# Patient Record
Sex: Male | Born: 1980
Health system: Southern US, Community
[De-identification: ages and names within clinical notes are randomized; demographics above are authoritative.]

## PROBLEM LIST (undated history)

## (undated) DIAGNOSIS — Z86010 Personal history of colonic polyps: Secondary | ICD-10-CM

## (undated) DIAGNOSIS — R040 Epistaxis: Secondary | ICD-10-CM

## (undated) DIAGNOSIS — M1990 Unspecified osteoarthritis, unspecified site: Secondary | ICD-10-CM

## (undated) DIAGNOSIS — K5731 Diverticulosis of large intestine without perforation or abscess with bleeding: Secondary | ICD-10-CM

## (undated) DIAGNOSIS — J45909 Unspecified asthma, uncomplicated: Secondary | ICD-10-CM

## (undated) DIAGNOSIS — K589 Irritable bowel syndrome without diarrhea: Secondary | ICD-10-CM

## (undated) DIAGNOSIS — I480 Paroxysmal atrial fibrillation: Secondary | ICD-10-CM

## (undated) DIAGNOSIS — K5100 Ulcerative (chronic) pancolitis without complications: Secondary | ICD-10-CM

## (undated) DIAGNOSIS — I872 Venous insufficiency (chronic) (peripheral): Secondary | ICD-10-CM

## (undated) DIAGNOSIS — I10 Essential (primary) hypertension: Secondary | ICD-10-CM

## (undated) DIAGNOSIS — D649 Anemia, unspecified: Secondary | ICD-10-CM

## (undated) DIAGNOSIS — N400 Enlarged prostate without lower urinary tract symptoms: Secondary | ICD-10-CM

## (undated) HISTORY — DX: Ulcerative (chronic) pancolitis without complications: K51.00

## (undated) HISTORY — DX: Paroxysmal atrial fibrillation: I48.0

## (undated) HISTORY — DX: Venous insufficiency (chronic) (peripheral): I87.2

## (undated) HISTORY — DX: Unspecified asthma, uncomplicated: J45.909

## (undated) HISTORY — DX: Diverticulosis of large intestine without perforation or abscess with bleeding: K57.31

## (undated) HISTORY — DX: Irritable bowel syndrome, unspecified: K58.9

## (undated) HISTORY — DX: Anemia, unspecified: D64.9

## (undated) HISTORY — DX: Unspecified osteoarthritis, unspecified site: M19.90

## (undated) HISTORY — DX: Epistaxis: R04.0

## (undated) HISTORY — DX: Essential (primary) hypertension: I10

## (undated) HISTORY — PX: OTHER SURGICAL HISTORY: SHX169

## (undated) HISTORY — DX: Benign prostatic hyperplasia without lower urinary tract symptoms: N40.0

---
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
## 2003-12-27 DIAGNOSIS — Z860101 Personal history of adenomatous and serrated colon polyps: Secondary | ICD-10-CM

## 2003-12-27 DIAGNOSIS — Z86010 Personal history of colonic polyps: Secondary | ICD-10-CM

## 2003-12-27 HISTORY — DX: Personal history of adenomatous and serrated colon polyps: Z86.0101

## 2003-12-27 HISTORY — DX: Personal history of colonic polyps: Z86.010

## 2008-12-23 ENCOUNTER — Emergency Department (HOSPITAL_COMMUNITY): Admission: EM | Admit: 2008-12-23 | Discharge: 2008-12-23 | Payer: Self-pay | Admitting: Emergency Medicine

## 2009-02-15 ENCOUNTER — Emergency Department (HOSPITAL_COMMUNITY): Admission: EM | Admit: 2009-02-15 | Discharge: 2009-02-15 | Payer: Self-pay | Admitting: Emergency Medicine

## 2011-03-07 LAB — URINALYSIS, ROUTINE W REFLEX MICROSCOPIC
Glucose, UA: NEGATIVE mg/dL
Ketones, ur: NEGATIVE mg/dL
pH: 6.5 (ref 5.0–8.0)

## 2011-03-11 LAB — RAPID STREP SCREEN (MED CTR MEBANE ONLY): Streptococcus, Group A Screen (Direct): NEGATIVE

## 2011-03-11 LAB — MONONUCLEOSIS SCREEN: Mono Screen: NEGATIVE

## 2014-10-24 ENCOUNTER — Ambulatory Visit (INDEPENDENT_AMBULATORY_CARE_PROVIDER_SITE_OTHER): Payer: Managed Care, Other (non HMO) | Admitting: Family Medicine

## 2014-10-24 ENCOUNTER — Encounter: Payer: Self-pay | Admitting: Family Medicine

## 2014-10-24 VITALS — BP 138/90 | Ht 69.0 in | Wt 180.0 lb

## 2014-10-24 DIAGNOSIS — Z0000 Encounter for general adult medical examination without abnormal findings: Secondary | ICD-10-CM

## 2014-10-24 NOTE — Progress Notes (Signed)
   Subjective:    Patient ID: James Berg, male    DOB: [birth year], 33 y.o.   MRN: [id number]  HPI The patient comes in today for a wellness visit. A review of their health history was completed.  A review of medications was also completed.  Any needed refills: No  Eating habits: Health conscious  Falls/  MVA accidents in past few months: No  Regular exercise: Yes, cardio/weight training 3-5 times per week.    Specialist pt sees on regular basis: No  Preventative health issues were discussed.   Additional concerns: No, refused flu vaccine.  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  HENT: Negative for congestion and rhinorrhea.   Eyes: Negative for discharge.  Respiratory: Negative for cough and wheezing.   Cardiovascular: Negative for chest pain.  Gastrointestinal: Negative for vomiting, abdominal pain and blood in stool.  Genitourinary: Negative for frequency and difficulty urinating.  Musculoskeletal: Negative for neck pain.  Skin: Negative for rash.  Allergic/Immunologic: Negative for environmental allergies and food allergies.  Neurological: Negative for weakness and headaches.  Psychiatric/Behavioral: Negative for agitation.  All other systems reviewed and are negative.      Objective:   Physical Exam  Constitutional: He appears well-developed and well-nourished.  HENT:  Head: Normocephalic and atraumatic.  Right Ear: External ear normal.  Left Ear: External ear normal.  Nose: Nose normal.  Mouth/Throat: Oropharynx is clear and moist.  Eyes: EOM are normal. Pupils are equal, round, and reactive to light.  Neck: Normal range of motion. Neck supple. No thyromegaly present.  Cardiovascular: Normal rate, regular rhythm and normal heart sounds.   No murmur  heard. Pulmonary/Chest: Effort normal and breath sounds normal. No respiratory distress. He has no wheezes.  Abdominal: Soft. Bowel sounds are normal. He exhibits no distension and no mass. There is no tenderness.  Genitourinary: Penis normal.  Musculoskeletal: Normal range of motion. He exhibits no edema.  Lymphadenopathy:    He has no cervical adenopathy.  Neurological: He is alert. He exhibits normal muscle tone.  Skin: Skin is warm and dry. No erythema.  Psychiatric: He has a normal mood and affect. His behavior is normal. Judgment normal.  Vitals reviewed.         Assessment & Plan:  Impression wellness exam plan diet exercise discussed. Appropriate blood work. WSL

## 2014-10-29 ENCOUNTER — Encounter: Payer: Self-pay | Admitting: *Deleted

## 2014-11-01 ENCOUNTER — Encounter: Payer: Self-pay | Admitting: Family Medicine

## 2014-11-01 LAB — HEPATIC FUNCTION PANEL
ALBUMIN: 4.2 g/dL (ref 3.5–5.2)
ALT: 30 U/L (ref 0–53)
AST: 23 U/L (ref 0–37)
Alkaline Phosphatase: 36 U/L — ABNORMAL LOW (ref 39–117)
BILIRUBIN DIRECT: 0.2 mg/dL (ref 0.0–0.3)
BILIRUBIN TOTAL: 0.6 mg/dL (ref 0.2–1.2)
Indirect Bilirubin: 0.4 mg/dL (ref 0.2–1.2)
Total Protein: 6.5 g/dL (ref 6.0–8.3)

## 2014-11-01 LAB — BASIC METABOLIC PANEL
BUN: 28 mg/dL — ABNORMAL HIGH (ref 6–23)
CALCIUM: 9.1 mg/dL (ref 8.4–10.5)
CHLORIDE: 104 meq/L (ref 96–112)
CO2: 30 meq/L (ref 19–32)
Creat: 1.09 mg/dL (ref 0.50–1.35)
GLUCOSE: 91 mg/dL (ref 70–99)
POTASSIUM: 4.5 meq/L (ref 3.5–5.3)
SODIUM: 140 meq/L (ref 135–145)

## 2014-11-01 LAB — LIPID PANEL
CHOL/HDL RATIO: 2.2 ratio
Cholesterol: 138 mg/dL (ref 0–200)
HDL: 63 mg/dL (ref >39)
LDL CALC: 65 mg/dL (ref 0–99)
Triglycerides: 52 mg/dL (ref <150)
VLDL: 10 mg/dL (ref 0–40)

## 2014-12-30 ENCOUNTER — Ambulatory Visit: Payer: Managed Care, Other (non HMO) | Admitting: Family Medicine

## 2014-12-30 ENCOUNTER — Encounter: Payer: Self-pay | Admitting: Family Medicine

## 2014-12-30 ENCOUNTER — Ambulatory Visit (INDEPENDENT_AMBULATORY_CARE_PROVIDER_SITE_OTHER): Payer: Managed Care, Other (non HMO) | Admitting: Family Medicine

## 2014-12-30 VITALS — BP 122/78 | Temp 98.4°F | Ht 69.0 in | Wt 181.4 lb

## 2014-12-30 DIAGNOSIS — A084 Viral intestinal infection, unspecified: Secondary | ICD-10-CM

## 2014-12-30 MED ORDER — ONDANSETRON HCL 8 MG PO TABS: 8.0000 mg | ORAL_TABLET | Freq: Three times a day (TID) | ORAL | Status: DC | PRN

## 2014-12-30 MED ORDER — PROMETHAZINE HCL 25 MG PO TABS: ORAL_TABLET | ORAL | Status: DC

## 2014-12-30 NOTE — Progress Notes (Signed)
   Subjective:    Patient ID: James Berg, male    DOB: [birth year], 34 y.o.   MRN: [id number]  Emesis  This is a new problem. The current episode started yesterday. The problem occurs intermittently. The problem has been unchanged. The emesis has an appearance of stomach contents. The maximum temperature recorded prior to his arrival was 100.4 - 100.9 F. Associated symptoms include abdominal pain, diarrhea and myalgias. Pertinent negatives include no chest pain or coughing. He has tried nothing for the symptoms. The treatment provided no relief.   all of his illness kicked in late afternoon started with nausea abdominal cramping then vomiting and diarrhea vomiting is eased up some today stopped at 3 AM. No bloody stools no bloody vomitus pmh benign   Review of Systems  Constitutional: Positive for appetite change. Negative for activity change.  HENT: Negative for congestion.   Respiratory: Negative for cough and chest tightness.   Cardiovascular: Negative for chest pain.  Gastrointestinal: Positive for vomiting, abdominal pain and diarrhea.  Genitourinary: Negative for frequency.  Musculoskeletal: Positive for myalgias.       Objective:   Physical Exam  Constitutional: He appears well-developed.  HENT:  Head: Normocephalic.  Neck: Normal range of motion. Neck supple.  Cardiovascular: Normal rate, regular rhythm and normal heart sounds.   Pulmonary/Chest: Breath sounds normal. No respiratory distress.  Abdominal: Soft. He exhibits no distension. There is no tenderness. There is no guarding.  Lymphadenopathy:    He has no cervical adenopathy.          Assessment & Plan:  Viral gastroenteritis no need for any antibiotics I don't recommend lab work or x-rays. Oral rehydration recommended clear sodas or diluted Gatorade. Should gradually improve over the next 48 hours Zofran for nausea Phenergan if necessary warning signs discussed

## 2015-06-28 DIAGNOSIS — Z029 Encounter for administrative examinations, unspecified: Secondary | ICD-10-CM

## 2015-07-26 ENCOUNTER — Encounter: Payer: Self-pay | Admitting: Family Medicine

## 2015-09-05 DIAGNOSIS — Z029 Encounter for administrative examinations, unspecified: Secondary | ICD-10-CM

## 2017-04-02 ENCOUNTER — Encounter: Payer: Self-pay | Admitting: Nurse Practitioner

## 2017-04-02 ENCOUNTER — Ambulatory Visit (INDEPENDENT_AMBULATORY_CARE_PROVIDER_SITE_OTHER): Payer: BLUE CROSS/BLUE SHIELD | Admitting: Nurse Practitioner

## 2017-04-02 VITALS — BP 124/80 | Temp 98.2°F | Ht 69.0 in | Wt 181.0 lb

## 2017-04-02 DIAGNOSIS — L237 Allergic contact dermatitis due to plants, except food: Secondary | ICD-10-CM | POA: Diagnosis not present

## 2017-04-02 MED ORDER — PREDNISONE 20 MG PO TABS: ORAL_TABLET | ORAL | 0 refills | Status: DC

## 2017-04-02 MED ORDER — METHYLPREDNISOLONE ACETATE 40 MG/ML IJ SUSP
40.0000 mg | Freq: Once | INTRAMUSCULAR | Status: AC
  Administered 2017-04-02: 40 mg via INTRAMUSCULAR

## 2017-04-02 NOTE — Progress Notes (Signed)
Subjective:  Presents for complaints of a poison oak rash spreading on his arms for the past 10 days. Was not able to contain rash with topical steroid cream. Has had a similar reaction to poison oak in the past. Began after working outside. No difficulty breathing or swallowing. No fever. Rash very pruritic. Has been spreading fairly quickly over the past few days.  Objective:   BP 124/80   Temp 98.2 F (36.8 C) (Oral)   Ht 5\' 9"  (1.753 m)   Wt 181 lb (82.1 kg)   BMI 26.73 kg/m  NAD. Alert, oriented. Lungs clear. Heart regular rate rhythm. Raised confluent linear vesicular rash noted on both lower arms with scattered smaller lesions on both arms, no other rash noted.  Assessment:  Allergic contact dermatitis due to plants, except food - Plan: methylPREDNISolone acetate (DEPO-MEDROL) injection 40 mg    Plan:   Meds ordered this encounter  Medications  . predniSONE (DELTASONE) 20 MG tablet    Sig: 3 po qd x 3 d then 2 po qd x 3 d then 1 po qd x 2 d; start 5/10    Dispense:  17 tablet    Refill:  0    Order Specific Question:   Supervising Provider    Answer:   Merlyn AlbertLUKING, Bryen S [[id number]]  . methylPREDNISolone acetate (DEPO-MEDROL) injection 40 mg   OTC antihistamines as directed. May continue OTC topical steroid. Warning signs reviewed. Call back in 4-5 days if no significant improvement, sooner if worse.

## 2017-04-02 NOTE — Patient Instructions (Signed)
claritin 10 mg in the morning Benadryl 25 mg in the evening

## 2017-10-21 ENCOUNTER — Telehealth: Payer: Self-pay | Admitting: Family Medicine

## 2017-10-21 DIAGNOSIS — Z0000 Encounter for general adult medical examination without abnormal findings: Secondary | ICD-10-CM

## 2017-10-21 NOTE — Telephone Encounter (Signed)
Rep same 

## 2017-10-21 NOTE — Telephone Encounter (Signed)
Pt.notified

## 2017-10-21 NOTE — Telephone Encounter (Signed)
Pt is requesting lab orders to be sent over for an upcoming physical. Last labs per epic were: bmp,hepatic,and lipid on 10/31/2014.

## 2017-10-21 NOTE — Telephone Encounter (Signed)
Orders put in. Left message for pt to return call  

## 2017-11-10 DIAGNOSIS — Z0000 Encounter for general adult medical examination without abnormal findings: Secondary | ICD-10-CM | POA: Diagnosis not present

## 2017-11-11 LAB — LIPID PANEL
CHOL/HDL RATIO: 2.5 ratio (ref 0.0–5.0)
Cholesterol, Total: 152 mg/dL (ref 100–199)
HDL: 60 mg/dL (ref >39)
LDL CALC: 81 mg/dL (ref 0–99)
TRIGLYCERIDES: 54 mg/dL (ref 0–149)
VLDL CHOLESTEROL CAL: 11 mg/dL (ref 5–40)

## 2017-11-11 LAB — BASIC METABOLIC PANEL
BUN / CREAT RATIO: 22 — AB (ref 9–20)
BUN: 22 mg/dL — AB (ref 6–20)
CALCIUM: 9 mg/dL (ref 8.7–10.2)
CHLORIDE: 104 mmol/L (ref 96–106)
CO2: 26 mmol/L (ref 20–29)
Creatinine, Ser: 0.99 mg/dL (ref 0.76–1.27)
GFR calc Af Amer: 114 mL/min/{1.73_m2} (ref >59)
GFR calc non Af Amer: 98 mL/min/{1.73_m2} (ref >59)
GLUCOSE: 101 mg/dL — AB (ref 65–99)
Potassium: 4.8 mmol/L (ref 3.5–5.2)
Sodium: 142 mmol/L (ref 134–144)

## 2017-11-11 LAB — HEPATIC FUNCTION PANEL
ALT: 34 IU/L (ref 0–44)
AST: 20 IU/L (ref 0–40)
Albumin: 4.2 g/dL (ref 3.5–5.5)
Alkaline Phosphatase: 42 IU/L (ref 39–117)
BILIRUBIN, DIRECT: 0.23 mg/dL (ref 0.00–0.40)
Bilirubin Total: 0.6 mg/dL (ref 0.0–1.2)
Total Protein: 6.4 g/dL (ref 6.0–8.5)

## 2017-11-20 ENCOUNTER — Encounter: Payer: Self-pay | Admitting: Family Medicine

## 2017-11-20 ENCOUNTER — Ambulatory Visit (INDEPENDENT_AMBULATORY_CARE_PROVIDER_SITE_OTHER): Payer: BLUE CROSS/BLUE SHIELD | Admitting: Family Medicine

## 2017-11-20 VITALS — BP 132/88 | Ht 69.0 in | Wt 187.1 lb

## 2017-11-20 DIAGNOSIS — Z0000 Encounter for general adult medical examination without abnormal findings: Secondary | ICD-10-CM

## 2017-11-20 NOTE — Progress Notes (Signed)
Subjective:    Patient ID: James Berg, male    DOB: 1981-06-06, 36 y.o.   MRN: [id number]  HPI The patient comes in today for a wellness visit.  Results for orders placed or performed in visit on 10/21/17  Lipid panel  Result Value Ref Range   Cholesterol, Total 152 100 - 199 mg/dL   Triglycerides 54 0 - 149 mg/dL   HDL 60 >40>39 mg/dL   VLDL Cholesterol Cal 11 5 - 40 mg/dL   LDL Calculated 81 0 - 99 mg/dL   Chol/HDL Ratio 2.5 0.0 - 5.0 ratio  Hepatic function panel  Result Value Ref Range   Total Protein 6.4 6.0 - 8.5 g/dL   Albumin 4.2 3.5 - 5.5 g/dL   Bilirubin Total 0.6 0.0 - 1.2 mg/dL   Bilirubin, Direct 9.810.23 0.00 - 0.40 mg/dL   Alkaline Phosphatase 42 39 - 117 IU/L   AST 20 0 - 40 IU/L   ALT 34 0 - 44 IU/L  Basic metabolic panel  Result Value Ref Range   Glucose 101 (H) 65 - 99 mg/dL   BUN 22 (H) 6 - 20 mg/dL   Creatinine, Ser 1.910.99 0.76 - 1.27 mg/dL   GFR calc non Af Amer 98 >59 mL/min/1.73   GFR calc Af Amer 114 >59 mL/min/1.73   BUN/Creatinine Ratio 22 (H) 9 - 20   Sodium 142 134 - 144 mmol/L   Potassium 4.8 3.5 - 5.2 mmol/L   Chloride 104 96 - 106 mmol/L   CO2 26 20 - 29 mmol/L   Calcium 9.0 8.7 - 10.2 mg/dL     A review of their health history was completed.  A review of medications was also completed.  Any needed refills; No  Eating habits: [place]/  MVA accidents in past few months: No  Regular exercise: No  Specialist pt sees on regular basis: No  Preventative health issues were discussed.   Additional concerns: None  BP decent when self cks   No tobacco no acohol  Exercise not good, not since 11 n ago,,  Some weight gain and loss of acrdio capability with no exercise  Migraines occur rarely, comes from remote accident along with hx of hip pin, chiro are helps      daypk and night pk with energy capsule vitamin an fish oils  And vyrotech ?? [facility] exact omponents, alsge suppp coronella ,,melatonin ,  did hepatitis studies  all negative,   Review of Systems  Constitutional: Negative for activity change, appetite change and fever.  HENT: Negative for congestion and rhinorrhea.   Eyes: Negative for discharge.  Respiratory: Negative for cough and wheezing.   Cardiovascular: Negative for chest pain.  Gastrointestinal: Negative for abdominal pain, blood in stool and vomiting.  Genitourinary: Negative for difficulty urinating and frequency.  Musculoskeletal: Negative for neck pain.  Skin: Negative for rash.  Allergic/Immunologic: Negative for environmental allergies and food allergies.  Neurological: Negative for weakness and headaches.  Psychiatric/Behavioral: Negative for agitation.  All other systems reviewed and are negative.      Objective:   Physical Exam  Constitutional: He appears well-developed and well-nourished.  HENT:  Head: Normocephalic and atraumatic.  Right Ear: External ear normal.  Left Ear: External ear normal.  Nose: Nose normal.  Mouth/Throat: Oropharynx is clear and moist.  Eyes: EOM are normal. Pupils are equal, round, and reactive to light.  Neck: Normal range of motion. Neck supple. No thyromegaly present.  Cardiovascular: Normal rate, regular rhythm  and normal heart sounds.  No murmur heard. Pulmonary/Chest: Effort normal and breath sounds normal. No respiratory distress. He has no wheezes.  Abdominal: Soft. Bowel sounds are normal. He exhibits no distension and no mass. There is no tenderness.  Genitourinary: Penis normal.  Musculoskeletal: Normal range of motion. He exhibits no edema.  Lymphadenopathy:    He has no cervical adenopathy.  Neurological: He is alert. He exhibits normal muscle tone.  Skin: Skin is warm and dry. No erythema.  Psychiatric: He has a normal mood and affect. His behavior is normal. Judgment normal.  Vitals reviewed.         Assessment & Plan:  Impression well-patient has been off and exercise.  Discussed and encouraged to reinstitute.  Diet  discussed.  Blood work discussed.  Exam.  No need for further testing at this time.  Patient still goes to a physical therapist rather chiropractor for regular adjustments which appeared to diminish this

## 2017-11-20 NOTE — Patient Instructions (Signed)
Results for orders placed or performed in visit on 10/21/17  Lipid panel  Result Value Ref Range   Cholesterol, Total 152 100 - 199 mg/dL   Triglycerides 54 0 - 149 mg/dL   HDL 60 >16>39 mg/dL   VLDL Cholesterol Cal 11 5 - 40 mg/dL   LDL Calculated 81 0 - 99 mg/dL   Chol/HDL Ratio 2.5 0.0 - 5.0 ratio  Hepatic function panel  Result Value Ref Range   Total Protein 6.4 6.0 - 8.5 g/dL   Albumin 4.2 3.5 - 5.5 g/dL   Bilirubin Total 0.6 0.0 - 1.2 mg/dL   Bilirubin, Direct 1.090.23 0.00 - 0.40 mg/dL   Alkaline Phosphatase 42 39 - 117 IU/L   AST 20 0 - 40 IU/L   ALT 34 0 - 44 IU/L  Basic metabolic panel  Result Value Ref Range   Glucose 101 (H) 65 - 99 mg/dL   BUN 22 (H) 6 - 20 mg/dL   Creatinine, Ser 6.040.99 0.76 - 1.27 mg/dL   GFR calc non Af Amer 98 >59 mL/min/1.73   GFR calc Af Amer 114 >59 mL/min/1.73   BUN/Creatinine Ratio 22 (H) 9 - 20   Sodium 142 134 - 144 mmol/L   Potassium 4.8 3.5 - 5.2 mmol/L   Chloride 104 96 - 106 mmol/L   CO2 26 20 - 29 mmol/L   Calcium 9.0 8.7 - 10.2 mg/dL

## 2017-12-18 DIAGNOSIS — M545 Low back pain: Secondary | ICD-10-CM | POA: Diagnosis not present

## 2017-12-18 DIAGNOSIS — M9902 Segmental and somatic dysfunction of thoracic region: Secondary | ICD-10-CM | POA: Diagnosis not present

## 2017-12-18 DIAGNOSIS — M9903 Segmental and somatic dysfunction of lumbar region: Secondary | ICD-10-CM | POA: Diagnosis not present

## 2018-01-06 ENCOUNTER — Telehealth: Payer: Self-pay | Admitting: Family Medicine

## 2018-01-06 MED ORDER — OSELTAMIVIR PHOSPHATE 75 MG PO CAPS: 75.0000 mg | ORAL_CAPSULE | Freq: Two times a day (BID) | ORAL | 0 refills | Status: DC

## 2018-01-06 NOTE — Telephone Encounter (Signed)
Go ahead bu,t takendont use unoless symtoms start

## 2018-01-06 NOTE — Telephone Encounter (Signed)
Prescription sent electronically to pharmacy. Patient notified. 

## 2018-01-06 NOTE — Telephone Encounter (Signed)
Both children just tested postive for the flu, and wife just started showing symptoms and is getting Tamiflu called in.  He wants to know if he can get some called in also to have on standby.  He does not have any symptoms yet.  [facility]

## 2018-05-07 DIAGNOSIS — M542 Cervicalgia: Secondary | ICD-10-CM | POA: Diagnosis not present

## 2018-05-07 DIAGNOSIS — M9903 Segmental and somatic dysfunction of lumbar region: Secondary | ICD-10-CM | POA: Diagnosis not present

## 2018-05-07 DIAGNOSIS — M545 Low back pain: Secondary | ICD-10-CM | POA: Diagnosis not present

## 2018-05-07 DIAGNOSIS — M9901 Segmental and somatic dysfunction of cervical region: Secondary | ICD-10-CM | POA: Diagnosis not present

## 2018-10-14 DIAGNOSIS — M546 Pain in thoracic spine: Secondary | ICD-10-CM | POA: Diagnosis not present

## 2018-10-14 DIAGNOSIS — M9901 Segmental and somatic dysfunction of cervical region: Secondary | ICD-10-CM | POA: Diagnosis not present

## 2018-10-14 DIAGNOSIS — M542 Cervicalgia: Secondary | ICD-10-CM | POA: Diagnosis not present

## 2018-10-30 DIAGNOSIS — M9902 Segmental and somatic dysfunction of thoracic region: Secondary | ICD-10-CM | POA: Diagnosis not present

## 2018-10-30 DIAGNOSIS — M542 Cervicalgia: Secondary | ICD-10-CM | POA: Diagnosis not present

## 2018-10-30 DIAGNOSIS — M546 Pain in thoracic spine: Secondary | ICD-10-CM | POA: Diagnosis not present

## 2018-10-30 DIAGNOSIS — M9901 Segmental and somatic dysfunction of cervical region: Secondary | ICD-10-CM | POA: Diagnosis not present

## 2018-11-17 ENCOUNTER — Telehealth: Payer: Self-pay | Admitting: Family Medicine

## 2018-11-17 MED ORDER — PREDNISONE 20 MG PO TABS: ORAL_TABLET | ORAL | 0 refills | Status: DC

## 2018-11-17 NOTE — Telephone Encounter (Signed)
Prescription sent electronically to pharmacy. Left message to return call to notify patient. 

## 2018-11-17 NOTE — Telephone Encounter (Signed)
Patient has some poison oak on his chest and a little on his arm and wanted to know if we could call in something to [facility].

## 2018-11-17 NOTE — Telephone Encounter (Signed)
Adult pred taper 

## 2018-11-17 NOTE — Telephone Encounter (Signed)
Patient notified

## 2018-11-17 NOTE — Telephone Encounter (Signed)
Please advise 

## 2019-04-27 ENCOUNTER — Other Ambulatory Visit: Payer: Self-pay

## 2019-04-27 ENCOUNTER — Ambulatory Visit (INDEPENDENT_AMBULATORY_CARE_PROVIDER_SITE_OTHER): Payer: BC Managed Care – PPO | Admitting: Family Medicine

## 2019-04-27 DIAGNOSIS — R21 Rash and other nonspecific skin eruption: Secondary | ICD-10-CM

## 2019-04-27 MED ORDER — PREDNISONE 20 MG PO TABS: ORAL_TABLET | ORAL | 0 refills | Status: DC

## 2019-04-27 NOTE — Progress Notes (Signed)
   Subjective:    Patient ID: James Berg, male    DOB: 16-Feb-1981, 38 y.o.   MRN: [id number] Audio plus visual HPI Pt has poison oak on face. Pt states this started on Saturday. One spot first and then yesterday it started spreading. No swelling no shortness of breath. Pt has been putting the clear calamine lotion.   Virtual Visit via Video Note  I connected with James Berg on 04/27/19 at 11:30 AM EDT by a video enabled telemedicine application and verified that I am speaking with the correct person using two identifiers.  Location: Patient: home Provider: office   I discussed the limitations of evaluation and management by telemedicine and the availability of in person appointments. The patient expressed understanding and agreed to proceed.  History of Present Illness:    Observations/Objective:   Assessment and Plan:   Follow Up Instructions:    I discussed the assessment and treatment plan with the patient. The patient was provided an opportunity to ask questions and all were answered. The patient agreed with the plan and demonstrated an understanding of the instructions.   The patient was advised to call back or seek an in-person evaluation if the symptoms worsen or if the condition fails to improve as anticipated.  I provided 15 minutes of non-face-to-face time during this encounter.  Developed substantial rash.  First moved into the lip.  Then she.  Also on her neck.  Scalp also involved.  Very pruritic.  Minimally responsive to topical calamine.  Using quite frequently [place], LPN    Review of Systems No headache, no major weight loss or weight gain, no chest pain no back pain abdominal pain no change in bowel habits complete ROS otherwise negative     Objective:   Physical Exam  Virtual visit      Assessment & Plan:  Impression contact dermatitis.  Avoidance measures discussed.  Local measures discussed.  Steroid taper prescribed  symptom care discussed questions answered

## 2019-09-02 ENCOUNTER — Other Ambulatory Visit: Payer: Self-pay | Admitting: *Deleted

## 2019-09-02 ENCOUNTER — Ambulatory Visit (INDEPENDENT_AMBULATORY_CARE_PROVIDER_SITE_OTHER): Payer: BC Managed Care – PPO | Admitting: Family Medicine

## 2019-09-02 ENCOUNTER — Other Ambulatory Visit: Payer: Self-pay

## 2019-09-02 DIAGNOSIS — J310 Chronic rhinitis: Secondary | ICD-10-CM

## 2019-09-02 DIAGNOSIS — Z20828 Contact with and (suspected) exposure to other viral communicable diseases: Secondary | ICD-10-CM | POA: Diagnosis not present

## 2019-09-02 DIAGNOSIS — Z20822 Contact with and (suspected) exposure to covid-19: Secondary | ICD-10-CM

## 2019-09-02 DIAGNOSIS — J329 Chronic sinusitis, unspecified: Secondary | ICD-10-CM | POA: Diagnosis not present

## 2019-09-02 MED ORDER — CEFDINIR 300 MG PO CAPS: 300.0000 mg | ORAL_CAPSULE | Freq: Two times a day (BID) | ORAL | 0 refills | Status: DC

## 2019-09-02 NOTE — Progress Notes (Signed)
   Subjective:  Audio post video  Patient ID: James Berg, male    DOB: October 05, 1981, 38 y.o.   MRN: [id number]  Sore Throat  This is a new problem. Episode onset: Friday  Associated symptoms include congestion and coughing. Associated symptoms comments: Did not sleep well due to neck discomfort last night, green mucus when blowing nose or coughing, pt states he was hot last night but no fever. Treatments tried: Mucinex, Ibuprofen and Vit C. The treatment provided mild relief.   Virtual Visit via Video Note  I connected with [name] on 09/02/19 at  2:00 PM EDT by a video enabled telemedicine application and verified that I am speaking with the correct person using two identifiers.  Location: Patient: phone Provider: office   I discussed the limitations of evaluation and management by telemedicine and the availability of in person appointments. The patient expressed understanding and agreed to proceed.  History of Present Illness:    Observations/Objective:   Assessment and Plan:   Follow Up Instructions:    I discussed the assessment and treatment plan with the patient. The patient was provided an opportunity to ask questions and all were answered. The patient agreed with the plan and demonstrated an understanding of the instructions.   The patient was advised to call back or seek an in-person evaluation if the symptoms worsen or if the condition fails to improve as anticipated.  I provided 18 minutes of non-face-to-face time during this encounter.   Vicente Males, LPN    Review of Systems  HENT: Positive for congestion.   Respiratory: Positive for cough.        Objective:   Physical Exam  Virtual      Assessment & Plan:  Impression probable rhinosinusitis.  Discussed.  Antibiotics prescribed symptom care discussed.  However with potential contact and symptoms at least somewhat consistent with coronavirus recommend testing for this rationale  discussed we will proceed

## 2019-09-04 DIAGNOSIS — B349 Viral infection, unspecified: Secondary | ICD-10-CM | POA: Diagnosis not present

## 2019-09-04 LAB — NOVEL CORONAVIRUS, NAA: SARS-CoV-2, NAA: NOT DETECTED

## 2019-09-05 ENCOUNTER — Encounter: Payer: Self-pay | Admitting: Family Medicine

## 2019-09-06 ENCOUNTER — Telehealth: Payer: Self-pay | Admitting: Family Medicine

## 2019-09-06 MED ORDER — CLARITHROMYCIN 500 MG PO TABS: ORAL_TABLET | ORAL | 0 refills | Status: DC

## 2019-09-06 NOTE — Telephone Encounter (Signed)
Please advise. Thank you

## 2019-09-06 NOTE — Telephone Encounter (Signed)
Pt was seen 09/02/2019 virtual with Dr. Richardson Landry &  at Urgent Care Saturday, pt's had a negative flu test & negative Covid test  Pt's been taking Advil & antibiotics - fever finally broke  Pt is having chills, shaking, body aches, coughing up brown stuff, yellow nasal congestion, weak-hard to walk, burning sensatoin on the skin back & thighs  Wife wonders if he should continue the cefdinir (OMNICEF) 300 MG capsule or maybe we could change med?   Please advise & call pt

## 2019-09-06 NOTE — Telephone Encounter (Signed)
Might be a good idea to switch to a different antibiotic class to see if covers better, stop omnicef, start biaxin 500 one bid seven d, this is likely viral and there are some fall viruses that can give you a flu like illness

## 2019-09-06 NOTE — Telephone Encounter (Signed)
Omnicef d/c. Biaxin sent to [facility]. Pt contacted and verbalized understanding.

## 2019-09-07 ENCOUNTER — Ambulatory Visit (HOSPITAL_COMMUNITY)
Admission: EM | Admit: 2019-09-07 | Discharge: 2019-09-07 | Disposition: A | Discharge status: Home / Self Care | Payer: BC Managed Care – PPO | Attending: Emergency Medicine | Admitting: Emergency Medicine

## 2019-09-07 ENCOUNTER — Other Ambulatory Visit: Payer: Self-pay

## 2019-09-07 ENCOUNTER — Ambulatory Visit (INDEPENDENT_AMBULATORY_CARE_PROVIDER_SITE_OTHER): Payer: BC Managed Care – PPO

## 2019-09-07 ENCOUNTER — Encounter (HOSPITAL_COMMUNITY): Payer: Self-pay

## 2019-09-07 DIAGNOSIS — R05 Cough: Secondary | ICD-10-CM | POA: Diagnosis not present

## 2019-09-07 DIAGNOSIS — R059 Cough, unspecified: Secondary | ICD-10-CM

## 2019-09-07 NOTE — Discharge Instructions (Addendum)
Your chest x-ray was abnormal.  Follow-up with your primary care provider in the morning to discuss the possibility of a CT scan.  Continue to take the antibiotic as prescribed by your primary care provider.  Go to the emergency department if you develop acute shortness of breath or worsening symptoms.

## 2019-09-07 NOTE — ED Triage Notes (Signed)
Patient presents to Urgent Care with complaints of burning in his chest intermittently since approx 2 days ago, progressively worsening. Patient reports his PCP sent him here for an x-ray to rule out pneumonia, pt has tested negative for COVID and flu.

## 2019-09-07 NOTE — ED Provider Notes (Signed)
[facility]    CSN: [id number] Arrival date & time: 09/07/19  1934      History   Chief Complaint Chief Complaint  Patient presents with  . chest x-ray    HPI James Berg is a 38 y.o. male.   Patient presents with request for a chest x-ray.  He states he was sent here by his PCP to have a chest x-ray done to rule out pneumonia.  He states he was seen by his PCP on 09/03/2019 by video visit for a fever and cough productive of yellow phlegm; he was started on Omnicef for sinusitis; he had a COVID test that day which later resulted negative.  He was seen in the urgent care on 09/04/2019 and had a negative flu test.  His PCP switched his antibiotic to Biaxin yesterday 09/06/2019.  Today patient reports that he is no longer having a fever but is still coughing up yellow phlegm.  He denies shortness of breath.  The history is provided by the patient.    Past Medical History:  Diagnosis Date  . Acute asthmatic bronchitis   . Anemia   . BPH (benign prostatic hyperplasia)   . Diverticulosis of colon with hemorrhage   . DJD (degenerative joint disease)   . Epistaxis   . Glaucoma   . Gout   . Hx of adenomatous colonic polyps 12/2003  . Hypertension   . IBS (irritable bowel syndrome)   . Paroxysmal atrial fibrillation (HCC)   . Universal ulcerative colitis (HCC)   . Venous insufficiency     There are no active problems to display for this patient.   Past Surgical History:  Procedure Laterality Date  . cataract extraction, right    . left arm surgery    . retinal detachment left eye  1991   Dr. Cecilie Kicks       Home Medications    Prior to Admission medications   Medication Sig Start Date End Date Taking? Authorizing Provider  clarithromycin (BIAXIN) 500 MG tablet Take one tablet po BID for 7 days 09/06/19  Yes Merlyn Albert, MD  oseltamivir (TAMIFLU) 75 MG capsule Take 1 capsule (75 mg total) by mouth 2 (two) times daily. Patient not taking: Reported  on 04/27/2019 01/06/18   Merlyn Albert, MD  predniSONE (DELTASONE) 20 MG tablet 3qd for 3d then 2qd for 3d then 1qd for 2d Patient not taking: Reported on 04/27/2019 11/17/18   Merlyn Albert, MD  predniSONE (DELTASONE) 20 MG tablet Take 3 tablets po for 3 days, then 2 tablets po for 3 days then one tablet po for 3 days Patient not taking: Reported on 09/02/2019 04/27/19   Merlyn Albert, MD    Family History Family History  Problem Relation Age of Onset  . Rectal cancer Son   . Throat cancer Father   . Hypertension Father   . Crohn's disease Son   . Healthy Mother     Social History Social History   Tobacco Use  . Smoking status: Never Smoker  . Smokeless tobacco: Never Used  Substance Use Topics  . Alcohol use: No  . Drug use: No     Allergies   Patient has no known allergies.   Review of Systems Review of Systems  Constitutional: Negative for chills and fever.  HENT: Negative for ear pain and sore throat.   Eyes: Negative for pain and visual disturbance.  Respiratory: Positive for cough. Negative for shortness of breath.  Cardiovascular: Negative for chest pain and palpitations.  Gastrointestinal: Negative for abdominal pain, diarrhea and vomiting.  Genitourinary: Negative for dysuria and hematuria.  Musculoskeletal: Negative for arthralgias and back pain.  Skin: Negative for color change and rash.  Neurological: Negative for seizures and syncope.  All other systems reviewed and are negative.    Physical Exam Triage Vital Signs ED Triage Vitals  Enc Vitals Group     BP 09/07/19 2005 (!) 145/83     Pulse Rate 09/07/19 2005 75     Resp 09/07/19 2005 17     Temp 09/07/19 2005 98.1 F (36.7 C)     Temp Source 09/07/19 2005 Temporal     SpO2 09/07/19 2005 100 %     Weight --      Height --      Head Circumference --      Peak Flow --      Pain Score 09/07/19 2003 0     Pain Loc --      Pain Edu? --      Excl. in [place]? --    No data found.  Updated  Vital Signs BP (!) 145/83 (BP Location: Right Arm)   Pulse 75   Temp 98.1 F (36.7 C) (Temporal)   Resp 17   SpO2 100%   Visual Acuity Right Eye Distance:   Left Eye Distance:   Bilateral Distance:    Right Eye Near:   Left Eye Near:    Bilateral Near:     Physical Exam Vitals signs and nursing note reviewed.  Constitutional:      General: He is not in acute distress.    Appearance: He is well-developed. He is not ill-appearing.  HENT:     Head: Normocephalic and atraumatic.     Right Ear: Tympanic membrane normal.     Left Ear: Tympanic membrane normal.     Nose: Nose normal.     Mouth/Throat:     Mouth: Mucous membranes are moist.     Pharynx: Oropharynx is clear.  Eyes:     Conjunctiva/sclera: Conjunctivae normal.  Neck:     Musculoskeletal: Neck supple.  Cardiovascular:     Rate and Rhythm: Normal rate and regular rhythm.     Heart sounds: No murmur.  Pulmonary:     Effort: Pulmonary effort is normal. No respiratory distress.     Breath sounds: Normal breath sounds. No wheezing or rhonchi.  Abdominal:     General: Bowel sounds are normal.     Palpations: Abdomen is soft.     Tenderness: There is no abdominal tenderness. There is no guarding or rebound.  Skin:    General: Skin is warm and dry.     Findings: No rash.  Neurological:     Mental Status: He is alert.      UC Treatments / Results  Labs (all labs ordered are listed, but only abnormal results are displayed) Labs Reviewed - No data to display  EKG   Radiology Dg Chest 2 View  Result Date: 09/07/2019 CLINICAL DATA:  38 year old male with productive cough. EXAM: CHEST - 2 VIEW COMPARISON:  Chest radiograph dated 02/15/2009 FINDINGS: There is a crescentic area pleural-based opacity measuring approximately 4.5 x 10.0 cm in the left lower lung field. There may be extrapleural or extrathoracic extension of this density into the soft tissues of the left chest wall. This may represent a focal area  of pulmonary consolidation or pneumonia. A pleural based or chest wall  mass is not excluded. CT may provide better evaluation. No destruction of the adjacent bones identified. The right lung is clear. There is no pleural effusion or pneumothorax. The cardiac silhouette is within normal limits. No acute osseous pathology. IMPRESSION: 1. Focal crescentic pleural-based opacity in the left mid to lower lung field as described. Clinical correlation is recommended. 2. No pneumothorax. Electronically Signed   By: Elgie CollardArash  Radparvar M.D.   On: 09/07/2019 20:47    Procedures Procedures (including critical care time)  Medications Ordered in UC Medications - No data to display  Initial Impression / Assessment and Plan / UC Course  I have reviewed the triage vital signs and the nursing notes.  Pertinent labs & imaging results that were available during my care of the patient were reviewed by me and considered in my medical decision making (see chart for details).   Cough.  Chest x-ray shows "Focal crescentic pleural-based opacity in the left mid to lower lung field".  Instructed patient to continue taking the antibiotic prescribed by his PCP.  Discussed with patient that he needs to follow-up with his PCP in the morning for the possibility of a CT scan.  Instructed patient to go to the emergency department if he has acute shortness of breath or worsening symptoms.  Patient agrees to plan of care.   Final Clinical Impressions(s) / UC Diagnoses   Final diagnoses:  Cough     Discharge Instructions     Your chest x-ray was abnormal.  Follow-up with your primary care provider in the morning to discuss the possibility of a CT scan.  Continue to take the antibiotic as prescribed by your primary care provider.  Go to the emergency department if you develop acute shortness of breath or worsening symptoms.       ED Prescriptions    None     PDMP not reviewed this encounter.   Mickie Bailate, Yaneliz Radebaugh H, NP  09/07/19 2055

## 2019-09-08 ENCOUNTER — Ambulatory Visit
Admission: RE | Admit: 2019-09-08 | Discharge: 2019-09-08 | Disposition: A | Discharge status: Home / Self Care | Payer: BC Managed Care – PPO | Source: Ambulatory Visit | Attending: Family Medicine | Admitting: Family Medicine

## 2019-09-08 ENCOUNTER — Other Ambulatory Visit: Payer: Self-pay

## 2019-09-08 ENCOUNTER — Ambulatory Visit (INDEPENDENT_AMBULATORY_CARE_PROVIDER_SITE_OTHER): Payer: BC Managed Care – PPO | Admitting: Family Medicine

## 2019-09-08 DIAGNOSIS — R059 Cough, unspecified: Secondary | ICD-10-CM

## 2019-09-08 DIAGNOSIS — R9389 Abnormal findings on diagnostic imaging of other specified body structures: Secondary | ICD-10-CM

## 2019-09-08 DIAGNOSIS — R05 Cough: Secondary | ICD-10-CM | POA: Diagnosis not present

## 2019-09-08 DIAGNOSIS — R042 Hemoptysis: Secondary | ICD-10-CM | POA: Diagnosis not present

## 2019-09-08 DIAGNOSIS — J181 Lobar pneumonia, unspecified organism: Secondary | ICD-10-CM | POA: Diagnosis not present

## 2019-09-08 MED ORDER — IOPAMIDOL (ISOVUE-300) INJECTION 61%
75.0000 mL | Freq: Once | INTRAVENOUS | Status: AC | PRN
  Administered 2019-09-08: 75 mL via INTRAVENOUS

## 2019-09-08 MED ORDER — AMOXICILLIN 500 MG PO CAPS: ORAL_CAPSULE | ORAL | 0 refills | Status: DC

## 2019-09-08 NOTE — Progress Notes (Signed)
   Subjective:  Audio   Patient ID: James Berg, male    DOB: [birth year], 38 y.o.   MRN: [id number]  HPIER follow up. Wants to discuss abnormal chest xray done in ED yesterday. Pt still having severe burning in chest and coughing up blood.   Virtual Visit via Telephone Note  I connected with James Berg on 09/08/19 at 11:30 AM EDT by telephone and verified that I am speaking with the correct person using two identifiers.  Location: Patient: home Provider: office   I discussed the limitations, risks, security and privacy concerns of performing an evaluation and management service by telephone and the availability of in person appointments. I also discussed with the patient that there may be a patient responsible charge related to this service. The patient expressed understanding and agreed to proceed.   History of Present Illness:    Observations/Objective:   Assessment and Plan:   Follow Up Instructions:    I discussed the assessment and treatment plan with the patient. The patient was provided an opportunity to ask questions and all were answered. The patient agreed with the plan and demonstrated an understanding of the instructions.   The patient was advised to call back or seek an in-person evaluation if the symptoms worsen or if the condition fails to improve as anticipated.  I provided 25 minutes of non-face-to-face time during this encounter.  Patient continues have ongoing substantial problems.  Persistent cough despite antibiotics.  Now the cough is productive of phlegm tinged with blood.  No acute shortness of breath.  Went on to the urgent care.  This note is reviewed.  Had an x-ray this report is reviewed.  That showed a lower lung infiltrate which was concerning.  There was question of even potential mass or extension into the chest wall.  Patient has no chronic symptoms this is all subacute within the last 10 or 12 days      Review of Systems No  headache, no major weight loss or weight gain, no chest pain no back pain abdominal pain no change in bowel habits complete ROS otherwise negative     Objective:   Physical Exam Virtual       Assessment & Plan:  Impression persistent symptomatology.  Negative COVID-19 test.  Now progressing the chest x-ray abnormality.  Long discussion held.  Decided to press on the CT scan  CT CAT scan report return.  This revealed a diffuse patchy pneumonic infiltrate.  Its groundglass appearance was highly suggestive of COVID-19 type pneumonia.  Fortunately there was nothing that suggested tumor or extension into the chest wall as originally concerned.  Long discussion held.  Despite negative coronavirus.  I believe this x-ray reveals true positive coronavirus pneumonia.  Therefore strong recommendation for patient and therefore family to consider this a positive COVID-19 situation.  Quarantine measures and self care discussed at length  Greater than 50% of this none 25 minute face to face visit was spent in counseling and discussion and coordination of care regarding the above diagnosis/diagnosies

## 2019-09-09 ENCOUNTER — Telehealth: Payer: Self-pay | Admitting: *Deleted

## 2019-09-09 NOTE — Telephone Encounter (Signed)
Discussed with pt. Pt verbalized understanding.  °

## 2019-09-09 NOTE — Telephone Encounter (Signed)
Discussed with pt and verbalized understanding. Pt did want to ask if it is normal for him to continue to cough up red stuff. Happened twice this morning. States he feels ok, burning in chest does not feel like he is struggling to breath. Just concerned about coughing up the blood.

## 2019-09-09 NOTE — Telephone Encounter (Signed)
Yes, totally normal with what is going on, patches of pneumonia, even when relatively mild, inflame blood vesels which lose  At ttimes a small amnt of blood, could last another week while body is healing

## 2019-09-09 NOTE — Telephone Encounter (Signed)
Ok. Let pt know this and so he wont be hearing from him, tho he has it as far as im concerned

## 2019-09-09 NOTE — Telephone Encounter (Signed)
[facility] [phone] and spoke with nurse Abigail Butts and asked how were they handling pt's diagnosed with covid but have a negative test. Explained to her the ct scan showed pneumonia consistent with covid 39. She states they are only counting patients with positive test.

## 2019-09-11 ENCOUNTER — Encounter: Payer: Self-pay | Admitting: Family Medicine

## 2019-09-13 ENCOUNTER — Encounter: Payer: Self-pay | Admitting: Family Medicine

## 2019-09-13 ENCOUNTER — Ambulatory Visit (INDEPENDENT_AMBULATORY_CARE_PROVIDER_SITE_OTHER): Payer: BC Managed Care – PPO | Admitting: Family Medicine

## 2019-09-13 DIAGNOSIS — U071 COVID-19: Secondary | ICD-10-CM

## 2019-09-13 NOTE — Progress Notes (Signed)
   Subjective:  Audio only  Patient ID: James Berg, male    DOB: 1981-09-21, 38 y.o.   MRN: [id number]  HPI  Patient calls for a follow up on a recent Urgent care visit for Covid. Patient states he feels some better today compared to last week. Does get short winded at times but nothing too extreme.  Virtual Visit via Video Note  I connected with James Berg on 09/13/19 at 10:00 AM EDT by a video enabled telemedicine application and verified that I am speaking with the correct person using two identifiers.  Location: Patient: home Provider: office   I discussed the limitations of evaluation and management by telemedicine and the availability of in person appointments. The patient expressed understanding and agreed to proceed.  History of Present Illness:    Observations/Objective:   Assessment and Plan:   Follow Up Instructions:    I discussed the assessment and treatment plan with the patient. The patient was provided an opportunity to ask questions and all were answered. The patient agreed with the plan and demonstrated an understanding of the instructions.   The patient was advised to call back or seek an in-person evaluation if the symptoms worsen or if the condition fails to improve as anticipated.  I provided 18 minutes of non-face-to-face time during this encounter.    Patient no longer having a fever.  His energy level has improved.  Still feels short of breath at times with exertion.  He is 10 days beyond his COVID-19 testing.  1 more day left on his antibiotics for pneumonia.  Works as an Public house manager.     Review of Systems No headache, no major weight loss or weight gain, no chest pain no back pain abdominal pain no change in bowel habits complete ROS otherwise negative     Objective:   Physical Exam  Virtual      Assessment & Plan:  Impression resolving COVID-19 pneumonia.  Exercise encouraged.  Warning signs discussed.  Patient now  able to technically leave quarantine.  Discussed.  Still would not visit any immune compromised congregation members rationale discussed expect slow resolution of all symptoms

## 2019-10-01 ENCOUNTER — Telehealth: Payer: Self-pay | Admitting: Family Medicine

## 2019-10-01 DIAGNOSIS — Z0000 Encounter for general adult medical examination without abnormal findings: Secondary | ICD-10-CM

## 2019-10-01 NOTE — Telephone Encounter (Signed)
Patient needing labs for physical on 11/17

## 2019-10-01 NOTE — Telephone Encounter (Signed)
Last labs completed on 11/10/17 BMET, LIPID, LIVER. Please advise. Thank you

## 2019-10-04 NOTE — Telephone Encounter (Signed)
Rep same 

## 2019-10-04 NOTE — Telephone Encounter (Signed)
Pt.notified

## 2019-10-04 NOTE — Telephone Encounter (Signed)
Orders put in and left pt a message to return call

## 2019-10-06 ENCOUNTER — Encounter: Payer: BC Managed Care – PPO | Admitting: Family Medicine

## 2019-10-07 DIAGNOSIS — Z0000 Encounter for general adult medical examination without abnormal findings: Secondary | ICD-10-CM | POA: Diagnosis not present

## 2019-10-08 LAB — BASIC METABOLIC PANEL
BUN/Creatinine Ratio: 19 (ref 9–20)
BUN: 21 mg/dL — ABNORMAL HIGH (ref 6–20)
CO2: 25 mmol/L (ref 20–29)
Calcium: 9.6 mg/dL (ref 8.7–10.2)
Chloride: 102 mmol/L (ref 96–106)
Creatinine, Ser: 1.11 mg/dL (ref 0.76–1.27)
GFR calc Af Amer: 98 mL/min/{1.73_m2} (ref >59)
GFR calc non Af Amer: 85 mL/min/{1.73_m2} (ref >59)
Glucose: 96 mg/dL (ref 65–99)
Potassium: 4.5 mmol/L (ref 3.5–5.2)
Sodium: 140 mmol/L (ref 134–144)

## 2019-10-08 LAB — HEPATIC FUNCTION PANEL
ALT: 31 IU/L (ref 0–44)
AST: 24 IU/L (ref 0–40)
Albumin: 4.4 g/dL (ref 4.0–5.0)
Alkaline Phosphatase: 49 IU/L (ref 39–117)
Bilirubin Total: 0.6 mg/dL (ref 0.0–1.2)
Bilirubin, Direct: 0.22 mg/dL (ref 0.00–0.40)
Total Protein: 6.2 g/dL (ref 6.0–8.5)

## 2019-10-08 LAB — LIPID PANEL
Chol/HDL Ratio: 2.5 ratio (ref 0.0–5.0)
Cholesterol, Total: 145 mg/dL (ref 100–199)
HDL: 59 mg/dL (ref >39)
LDL Chol Calc (NIH): 71 mg/dL (ref 0–99)
Triglycerides: 74 mg/dL (ref 0–149)
VLDL Cholesterol Cal: 15 mg/dL (ref 5–40)

## 2019-10-12 ENCOUNTER — Other Ambulatory Visit: Payer: Self-pay

## 2019-10-12 ENCOUNTER — Ambulatory Visit (INDEPENDENT_AMBULATORY_CARE_PROVIDER_SITE_OTHER): Payer: BC Managed Care – PPO | Admitting: Family Medicine

## 2019-10-12 ENCOUNTER — Encounter: Payer: Self-pay | Admitting: Family Medicine

## 2019-10-12 VITALS — BP 128/80 | Temp 97.8°F | Ht 68.25 in | Wt 172.0 lb

## 2019-10-12 DIAGNOSIS — Z0000 Encounter for general adult medical examination without abnormal findings: Secondary | ICD-10-CM | POA: Diagnosis not present
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
## 2019-12-27 ENCOUNTER — Encounter: Payer: Self-pay | Admitting: Family Medicine

## 2020-03-24 IMAGING — CT CT CHEST W/ CM
1 series · 15 of 34 positions shown, 19 images · IV contrast (APPLIED)
Comparison: Chest radiograph 09/07/2019.

CLINICAL DATA: Hemoptysis, cough, abnormal chest radiograph.

EXAM:
CT CHEST WITH CONTRAST
TECHNIQUE: Multidetector CT imaging of the chest was performed during
intravenous contrast administration.
CONTRAST:  75mL [id number] IOPAMIDOL ([id number]) INJECTION 61%

[Series 2: chest w/cm · axial · 0.74mm/px · z∈[-331,-43]mm · 15 of 170 slices shown, 19 images]
[im 13/170  mediastinal]
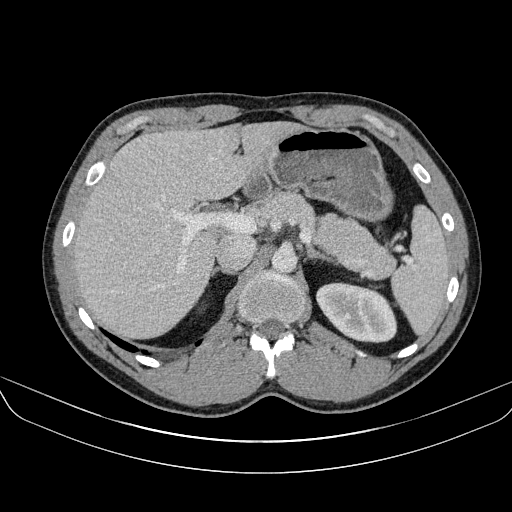
[im 13/170  lung]
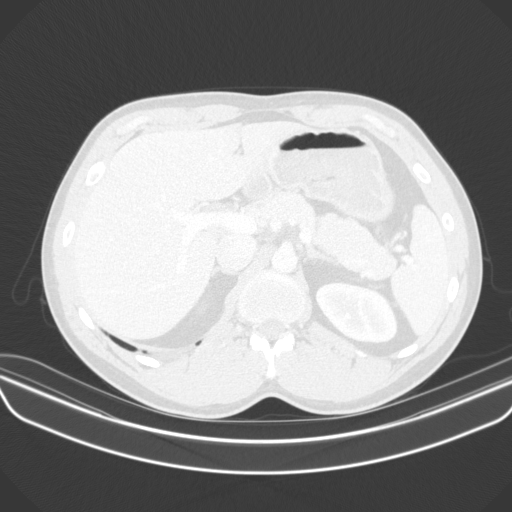
[im 26/170  lung]
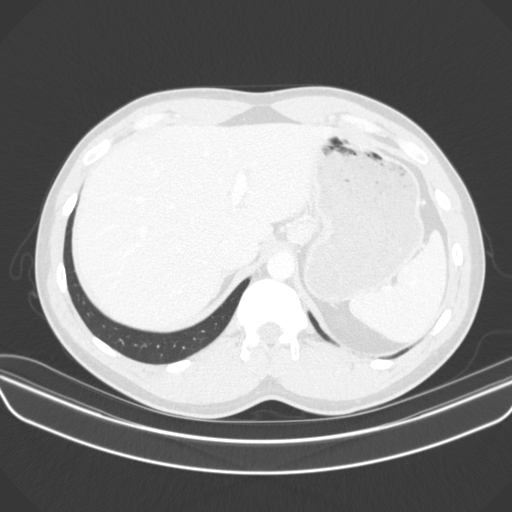
[im 34/170  lung]
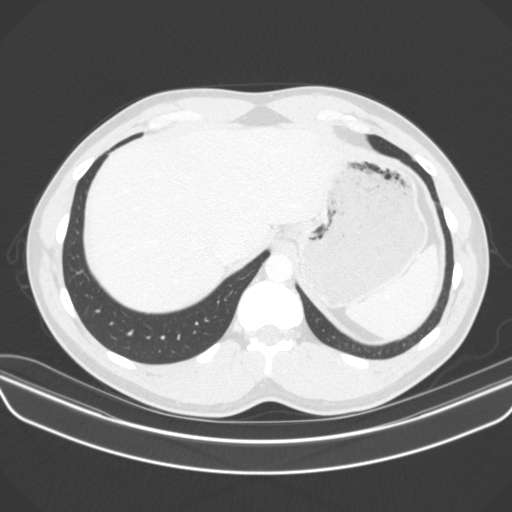
[im 44/170  lung]
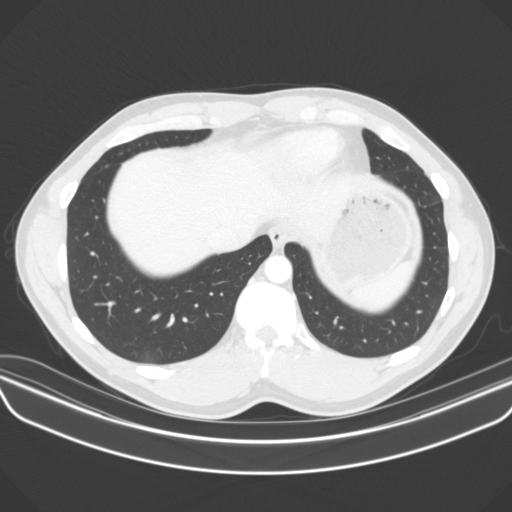
[im 57/170  mediastinal]
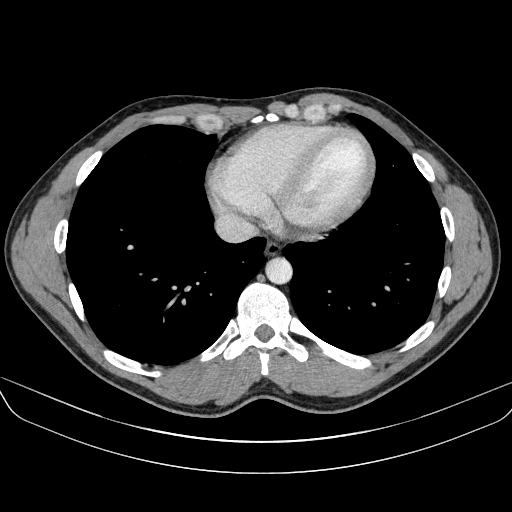
[im 57/170  lung]
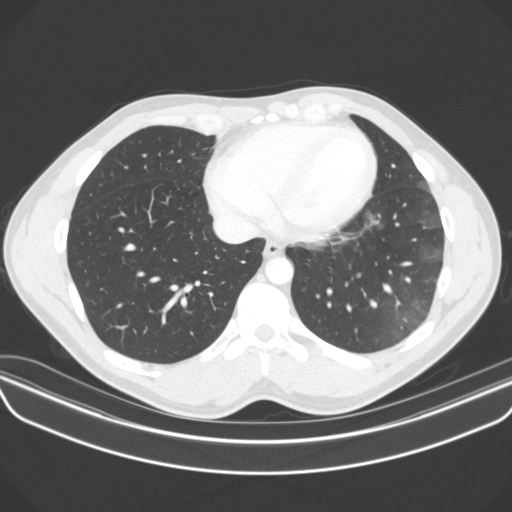
[im 68/170  lung]
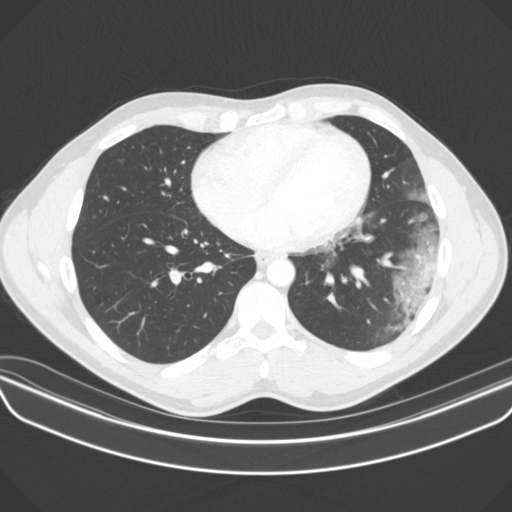
[im 76/170  lung]
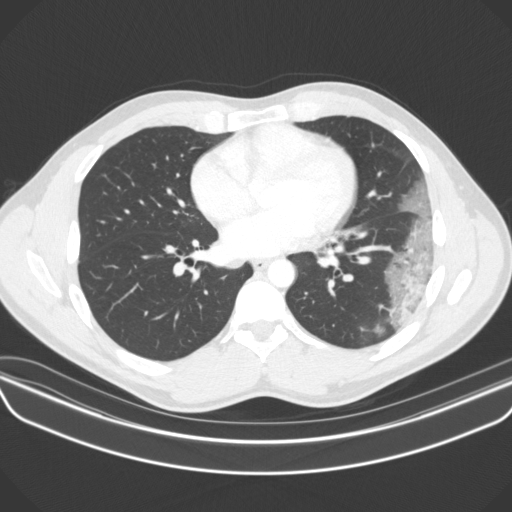
[im 88/170  lung]
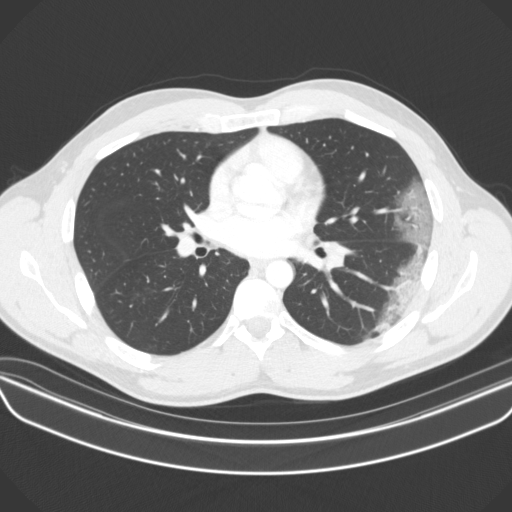
[im 94/170  mediastinal]
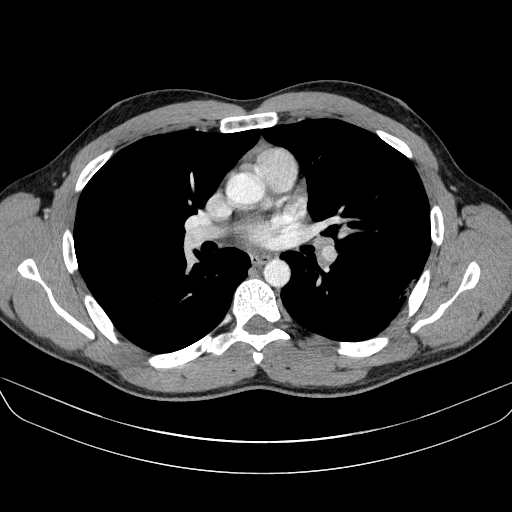
[im 94/170  lung]
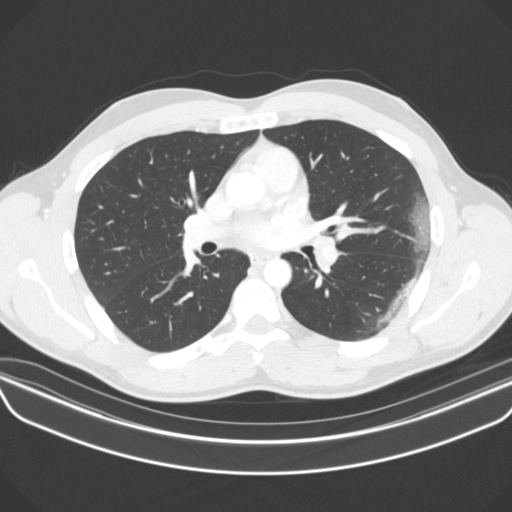
[im 102/170  lung]
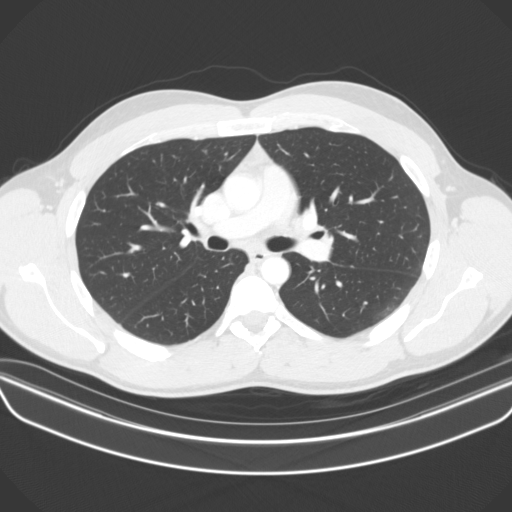
[im 113/170  lung]
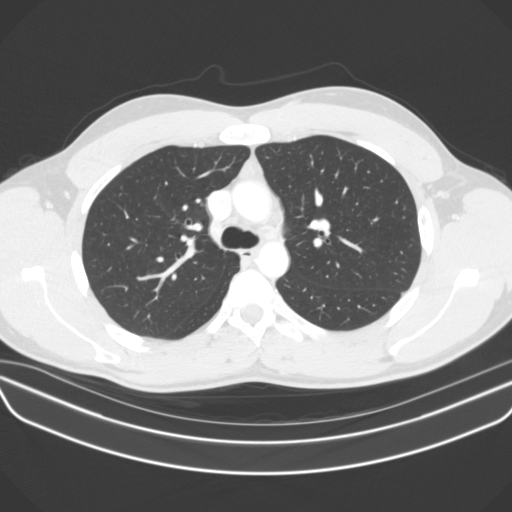
[im 126/170  lung]
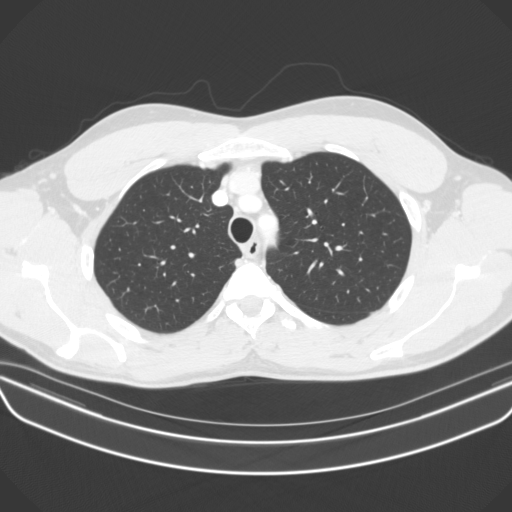
[im 136/170  mediastinal]
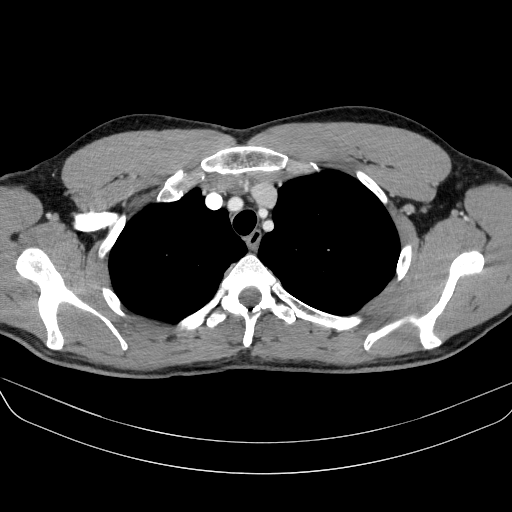
[im 136/170  lung]
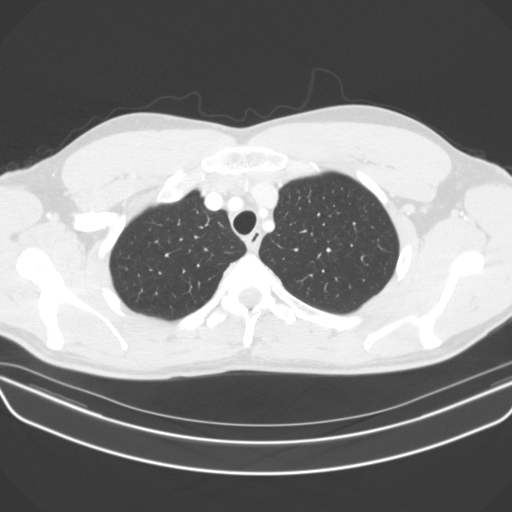
[im 144/170  lung]
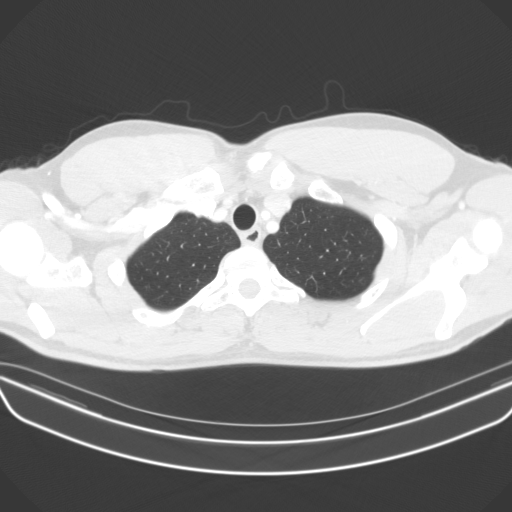
[im 157/170  lung]
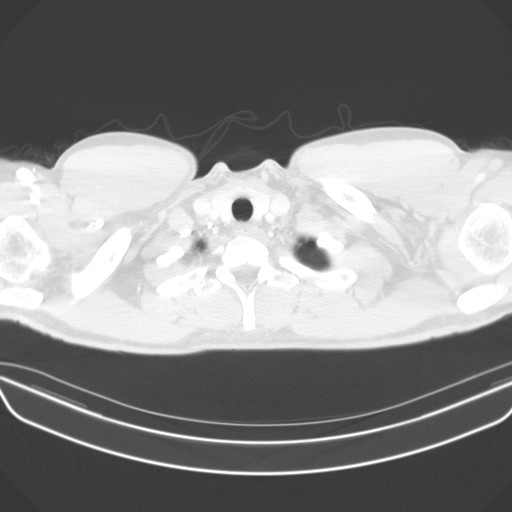

[15 of 34 positions shown; findings below may reference images not displayed]

FINDINGS: Cardiovascular: Heart size normal.  No pericardial effusion.

Mediastinum/Nodes: Triangular-shaped thymic tissue in the
prevascular space. Mediastinal lymph nodes are not enlarged by CT
size criteria. Left hilar lymph nodes measure up to 9 mm. No right
hilar or axillary adenopathy. Esophagus is unremarkable.

Lungs/Pleura: Patchy ground-glass and consolidation are seen in the
peripheral aspects of the lingula and left lower lobe. Minimal
peripheral ground-glass in the posterior right lower lobe. Lungs are
otherwise clear. No pleural fluid. Airway is unremarkable.

Upper Abdomen: Visualized portions of the liver, gallbladder,
adrenal glands, kidneys, spleen, pancreas, stomach and bowel are
grossly unremarkable. No upper abdominal adenopathy.

Musculoskeletal: Negative.
IMPRESSION: Peripheral pulmonary parenchymal ground-glass and consolidation
involving primarily the lingula and left lower lobe, with minimal
involvement of the right lower lobe. Findings may be due to
pulmonary hemorrhage or pneumonia, including [id number], for which
testing is advised.

## 2022-02-28 ENCOUNTER — Ambulatory Visit (INDEPENDENT_AMBULATORY_CARE_PROVIDER_SITE_OTHER): Payer: 59 | Admitting: Family Medicine

## 2022-02-28 VITALS — BP 130/84 | HR 58 | Temp 98.6°F | Ht 68.25 in | Wt 181.8 lb

## 2022-02-28 DIAGNOSIS — Z0000 Encounter for general adult medical examination without abnormal findings: Secondary | ICD-10-CM | POA: Diagnosis not present

## 2022-02-28 DIAGNOSIS — Z6827 Body mass index (BMI) 27.0-27.9, adult: Secondary | ICD-10-CM

## 2022-02-28 DIAGNOSIS — Z13220 Encounter for screening for lipoid disorders: Secondary | ICD-10-CM

## 2022-02-28 DIAGNOSIS — Z130 Encounter for screening for diseases of the blood and blood-forming organs and certain disorders involving the immune mechanism: Secondary | ICD-10-CM

## 2022-02-28 NOTE — Patient Instructions (Signed)
Labs today.  Follow up annually.  Take care  Dr. Jossilyn Benda  

## 2022-03-01 DIAGNOSIS — Z0000 Encounter for general adult medical examination without abnormal findings: Secondary | ICD-10-CM | POA: Insufficient documentation

## 2022-03-01 LAB — CBC
Hematocrit: 44.2 % (ref 37.5–51.0)
Hemoglobin: 15.2 g/dL (ref 13.0–17.7)
MCH: 30.2 pg (ref 26.6–33.0)
MCHC: 34.4 g/dL (ref 31.5–35.7)
MCV: 88 fL (ref 79–97)
Platelets: 235 10*3/uL (ref 150–450)
RBC: 5.03 x10E6/uL (ref 4.14–5.80)
RDW: 11.8 % (ref 11.6–15.4)
WBC: 3.7 10*3/uL (ref 3.4–10.8)

## 2022-03-01 LAB — CMP14+EGFR
ALT: 39 IU/L (ref 0–44)
AST: 23 IU/L (ref 0–40)
Albumin/Globulin Ratio: 2.3 — ABNORMAL HIGH (ref 1.2–2.2)
Albumin: 4.6 g/dL (ref 4.0–5.0)
Alkaline Phosphatase: 49 IU/L (ref 44–121)
BUN/Creatinine Ratio: 19 (ref 9–20)
BUN: 21 mg/dL — ABNORMAL HIGH (ref 6–20)
Bilirubin Total: 0.7 mg/dL (ref 0.0–1.2)
CO2: 25 mmol/L (ref 20–29)
Calcium: 9.5 mg/dL (ref 8.7–10.2)
Chloride: 102 mmol/L (ref 96–106)
Creatinine, Ser: 1.12 mg/dL (ref 0.76–1.27)
Globulin, Total: 2 g/dL (ref 1.5–4.5)
Glucose: 96 mg/dL (ref 70–99)
Potassium: 4.9 mmol/L (ref 3.5–5.2)
Sodium: 142 mmol/L (ref 134–144)
Total Protein: 6.6 g/dL (ref 6.0–8.5)
eGFR: 86 mL/min/{1.73_m2} (ref >59)

## 2022-03-01 LAB — LIPID PANEL
Chol/HDL Ratio: 2.5 ratio (ref 0.0–5.0)
Cholesterol, Total: 163 mg/dL (ref 100–199)
HDL: 64 mg/dL (ref >39)
LDL Chol Calc (NIH): 89 mg/dL (ref 0–99)
Triglycerides: 49 mg/dL (ref 0–149)
VLDL Cholesterol Cal: 10 mg/dL (ref 5–40)

## 2022-03-01 NOTE — Progress Notes (Signed)
? ?Subjective:  ?Patient ID: James Berg, male    DOB: 20-Nov-1981  Age: 41 y.o. MRN: [id number] ? ?CC:  Annual physical exam ? ?HPI: ? ?41 year old male with significant past medical history presents for annual physical exam. Patient is doing well and has no complaints or concerns at this time. ? ?Preventative Healthcare ?Colonoscopy: Not indicated. ?Immunizations ?Tetanus - Up to date. ?Flu - Declines at this time. ?Prostate cancer screening: Not yet indicated. ?Hepatitis C screening - Declines. ?Labs: Screening labs today. ?Exercise: Yes; 3-4 days/week. ?Alcohol use: No. ?Smoking/tobacco use: No. ?STD/HIV testing: Declines. ?Regular dental exams: Yes. ? ?Social Hx   ?Social History  ? ?Socioeconomic History  ? Marital status: Married  ?  Spouse name: Nain Rudd  ? Number of children: 3  ? Years of education: Not on file  ? Highest education level: Not on file  ?Occupational History  ? Occupation: prev pastor  ?Tobacco Use  ? Smoking status: Never  ? Smokeless tobacco: Never  ?Vaping Use  ? Vaping Use: Never used  ?Substance and Sexual Activity  ? Alcohol use: No  ? Drug use: No  ? Sexual activity: Not on file  ?Other Topics Concern  ? Not on file  ?Social History Narrative  ? Not on file  ? ?Social Determinants of Health  ? ?Financial Resource Strain: Not on file  ?Food Insecurity: Not on file  ?Transportation Needs: Not on file  ?Physical Activity: Not on file  ?Stress: Not on file  ?Social Connections: Not on file  ? ? ?Review of Systems  ?Constitutional: Negative.   ?Respiratory: Negative.    ?Gastrointestinal: Negative.   ? ?Objective:  ?BP 130/84   Pulse (!) 58   Temp 98.6 ?F (37 ?C) (Oral)   Ht 5' 8.25" (1.734 m)   Wt 181 lb 12.8 oz (82.5 kg)   SpO2 100%   BMI 27.44 kg/m?  ? ? ?  02/28/2022  ?  8:57 AM 10/12/2019  ?  1:55 PM 09/07/2019  ?  8:05 PM  ?BP/Weight  ?Systolic BP 128 786 767  ?Diastolic BP 84 80 83  ?Wt. (Lbs) 181.8 172   ?BMI 27.44 kg/m2 25.96 kg/m2   ? ? ?Physical Exam ?Vitals and  nursing note reviewed.  ?Constitutional:   ?   General: He is not in acute distress. ?   Appearance: Normal appearance. He is not ill-appearing.  ?HENT:  ?   Head: Normocephalic and atraumatic.  ?   Right Ear: Tympanic membrane normal.  ?   Left Ear: Tympanic membrane normal.  ?   Nose: Nose normal.  ?   Mouth/Throat:  ?   Pharynx: Oropharynx is clear. No oropharyngeal exudate or posterior oropharyngeal erythema.  ?Eyes:  ?   General:     ?   Right eye: No discharge.     ?   Left eye: No discharge.  ?   Conjunctiva/sclera: Conjunctivae normal.  ?Cardiovascular:  ?   Rate and Rhythm: Normal rate and regular rhythm.  ?   Heart sounds: No murmur heard. ?Pulmonary:  ?   Effort: Pulmonary effort is normal.  ?   Breath sounds: Normal breath sounds. No wheezing, rhonchi or rales.  ?Abdominal:  ?   General: There is no distension.  ?   Palpations: Abdomen is soft.  ?   Tenderness: There is no abdominal tenderness.  ?Neurological:  ?   General: No focal deficit present.  ?   Mental Status: He is alert.  ?  Psychiatric:     ?   Mood and Affect: Mood normal.     ?   Behavior: Behavior normal.  ? ? ?Lab Results  ?Component Value Date  ? WBC 3.7 02/28/2022  ? HGB 15.2 02/28/2022  ? HCT 44.2 02/28/2022  ? PLT 235 02/28/2022  ? GLUCOSE 96 02/28/2022  ? CHOL 163 02/28/2022  ? TRIG 49 02/28/2022  ? HDL 64 02/28/2022  ? [place] 89 02/28/2022  ? ALT 39 02/28/2022  ? AST 23 02/28/2022  ? NA 142 02/28/2022  ? K 4.9 02/28/2022  ? CL 102 02/28/2022  ? CREATININE 1.12 02/28/2022  ? BUN 21 (H) 02/28/2022  ? CO2 25 02/28/2022  ? ? ? ?Assessment & Plan:  ? ?Problem List Items Addressed This Visit   ? ?  ? Other  ? Annual physical exam - Primary  ?  Screening labs today. ?Declines HIV and hepatitis C screening. ?Follow-up annually. ?  ?  ? ?Other Visit Diagnoses   ? ? Screening for deficiency anemia      ? Relevant Orders  ? CBC (Completed)  ? BMI 27.0-27.9,adult      ? Relevant Orders  ? CMP14+EGFR (Completed)  ? Screening for lipid disorders       ? Relevant Orders  ? Lipid panel (Completed)  ? ?  ? ?Follow-up:  Annually ? ?Thersa Salt DO ?[place] ? ?

## 2022-03-01 NOTE — Assessment & Plan Note (Signed)
Screening labs today. ?Declines HIV and hepatitis C screening. ?Follow-up annually. ?

## 2022-12-05 DIAGNOSIS — J101 Influenza due to other identified influenza virus with other respiratory manifestations: Secondary | ICD-10-CM | POA: Diagnosis not present

## 2022-12-05 DIAGNOSIS — J029 Acute pharyngitis, unspecified: Secondary | ICD-10-CM | POA: Diagnosis not present

## 2022-12-05 DIAGNOSIS — Z20822 Contact with and (suspected) exposure to covid-19: Secondary | ICD-10-CM | POA: Diagnosis not present

## 2022-12-09 DIAGNOSIS — H66002 Acute suppurative otitis media without spontaneous rupture of ear drum, left ear: Secondary | ICD-10-CM | POA: Diagnosis not present

## 2022-12-09 DIAGNOSIS — R55 Syncope and collapse: Secondary | ICD-10-CM | POA: Diagnosis not present

## 2022-12-09 DIAGNOSIS — J101 Influenza due to other identified influenza virus with other respiratory manifestations: Secondary | ICD-10-CM | POA: Diagnosis not present

## 2022-12-09 DIAGNOSIS — R509 Fever, unspecified: Secondary | ICD-10-CM | POA: Diagnosis not present

## 2023-02-05 DIAGNOSIS — H1010 Acute atopic conjunctivitis, unspecified eye: Secondary | ICD-10-CM | POA: Diagnosis not present

## 2023-02-05 DIAGNOSIS — H66001 Acute suppurative otitis media without spontaneous rupture of ear drum, right ear: Secondary | ICD-10-CM | POA: Diagnosis not present

## 2023-02-05 DIAGNOSIS — J028 Acute pharyngitis due to other specified organisms: Secondary | ICD-10-CM | POA: Diagnosis not present

## 2023-02-05 DIAGNOSIS — Z20822 Contact with and (suspected) exposure to covid-19: Secondary | ICD-10-CM | POA: Diagnosis not present

## 2023-07-14 ENCOUNTER — Telehealth: Payer: Self-pay | Admitting: Family Medicine

## 2023-07-14 NOTE — Telephone Encounter (Signed)
Patient has a physical scheduled for next month would like to know if he needs labs prior to that appointment.  CB# [phone]

## 2023-07-15 ENCOUNTER — Other Ambulatory Visit: Payer: Self-pay

## 2023-07-15 DIAGNOSIS — Z13220 Encounter for screening for lipoid disorders: Secondary | ICD-10-CM

## 2023-07-15 DIAGNOSIS — Z0000 Encounter for general adult medical examination without abnormal findings: Secondary | ICD-10-CM

## 2023-07-15 NOTE — Telephone Encounter (Signed)
Called pt and informed him labs have been ordered and that we recommend having labs done the week before appointment

## 2023-07-15 NOTE — Telephone Encounter (Signed)
Cook, Jayce G, DO     CBC, CMP, Lipid.   

## 2023-08-08 DIAGNOSIS — Z0000 Encounter for general adult medical examination without abnormal findings: Secondary | ICD-10-CM | POA: Diagnosis not present

## 2023-08-08 DIAGNOSIS — Z13220 Encounter for screening for lipoid disorders: Secondary | ICD-10-CM | POA: Diagnosis not present

## 2023-08-09 LAB — COMPREHENSIVE METABOLIC PANEL
ALT: 40 IU/L (ref 0–44)
AST: 24 IU/L (ref 0–40)
Albumin: 3.9 g/dL — ABNORMAL LOW (ref 4.1–5.1)
Alkaline Phosphatase: 50 IU/L (ref 44–121)
BUN/Creatinine Ratio: 18 (ref 9–20)
BUN: 21 mg/dL (ref 6–24)
Bilirubin Total: 0.5 mg/dL (ref 0.0–1.2)
CO2: 24 mmol/L (ref 20–29)
Calcium: 9.1 mg/dL (ref 8.7–10.2)
Chloride: 105 mmol/L (ref 96–106)
Creatinine, Ser: 1.18 mg/dL (ref 0.76–1.27)
Globulin, Total: 2.2 g/dL (ref 1.5–4.5)
Glucose: 90 mg/dL (ref 70–99)
Potassium: 4.6 mmol/L (ref 3.5–5.2)
Sodium: 142 mmol/L (ref 134–144)
Total Protein: 6.1 g/dL (ref 6.0–8.5)
eGFR: 80 mL/min/{1.73_m2} (ref >59)

## 2023-08-09 LAB — LIPID PANEL
Chol/HDL Ratio: 2.5 ratio (ref 0.0–5.0)
Cholesterol, Total: 142 mg/dL (ref 100–199)
HDL: 57 mg/dL (ref >39)
LDL Chol Calc (NIH): 75 mg/dL (ref 0–99)
Triglycerides: 42 mg/dL (ref 0–149)
VLDL Cholesterol Cal: 10 mg/dL (ref 5–40)

## 2023-08-09 LAB — CBC WITH DIFFERENTIAL/PLATELET
Basophils Absolute: 0 10*3/uL (ref 0.0–0.2)
Basos: 1 %
EOS (ABSOLUTE): 0.1 10*3/uL (ref 0.0–0.4)
Eos: 2 %
Hematocrit: 42 % (ref 37.5–51.0)
Hemoglobin: 14.1 g/dL (ref 13.0–17.7)
Immature Grans (Abs): 0 10*3/uL (ref 0.0–0.1)
Immature Granulocytes: 0 %
Lymphocytes Absolute: 2 10*3/uL (ref 0.7–3.1)
Lymphs: 43 %
MCH: 30.4 pg (ref 26.6–33.0)
MCHC: 33.6 g/dL (ref 31.5–35.7)
MCV: 91 fL (ref 79–97)
Monocytes Absolute: 0.4 10*3/uL (ref 0.1–0.9)
Monocytes: 8 %
Neutrophils Absolute: 2.1 10*3/uL (ref 1.4–7.0)
Neutrophils: 46 %
Platelets: 251 10*3/uL (ref 150–450)
RBC: 4.64 x10E6/uL (ref 4.14–5.80)
RDW: 11.9 % (ref 11.6–15.4)
WBC: 4.6 10*3/uL (ref 3.4–10.8)

## 2023-08-19 ENCOUNTER — Ambulatory Visit (INDEPENDENT_AMBULATORY_CARE_PROVIDER_SITE_OTHER): Payer: 59 | Admitting: Family Medicine

## 2023-08-19 VITALS — BP 120/78 | HR 72 | Temp 98.2°F | Ht 68.25 in | Wt 179.6 lb

## 2023-08-19 DIAGNOSIS — Z0000 Encounter for general adult medical examination without abnormal findings: Secondary | ICD-10-CM

## 2023-08-19 NOTE — Assessment & Plan Note (Signed)
Doing well.  Labs reviewed with the patient.  Labs well-controlled.  Declined flu vaccine and COVID-19 vaccination.  Wants to wait on tetanus as well.  Advised healthy diet and regular exercise.  Good hand hygiene.  Follow-up annually.

## 2023-08-19 NOTE — Patient Instructions (Signed)
You're doing well.  Follow up annually or sooner if needed.  Take care  Dr. Adriana Simas

## 2023-08-19 NOTE — Progress Notes (Signed)
Subjective:  Patient ID: James Berg, male    DOB: 1981/09/12  Age: 42 y.o. MRN: [id number]  CC: Chief Complaint  Patient presents with   Annual Exam    HPI:  42 year old healthy male presents for an annual physical exam.  Patient states that he has had some intermittent low back pain and right shoulder pain.  Overall he has been doing well.  Sees a chiropractor for this.  Recent labs normal.  Cholesterol well-controlled.  In regards to patient's preventative health care, he declines COVID-19 vaccination as well as influenza vaccine.  He states that he believes that he has had a tetanus in the past 5 years.  Declines today.  No other preventative healthcare items are needed at this time.  Patient Active Problem List   Diagnosis Date Noted   Annual physical exam 03/01/2022    Social Hx   Social History   Socioeconomic History   Marital status: Married    Spouse name: Andrus Mclain   Number of children: 3   Years of education: Not on file   Highest education level: Not on file  Occupational History   Occupation: prev pastor  Tobacco Use   Smoking status: Never   Smokeless tobacco: Never  Vaping Use   Vaping status: Never Used  Substance and Sexual Activity   Alcohol use: No   Drug use: No   Sexual activity: Not on file  Other Topics Concern   Not on file  Social History Narrative   Not on file   Social Determinants of Health   Financial Resource Strain: Not on file  Food Insecurity: Not on file  Transportation Needs: Not on file  Physical Activity: Not on file  Stress: Not on file  Social Connections: Not on file    Review of Systems  Constitutional: Negative.   Respiratory: Negative.    Cardiovascular: Negative.    Per HPI  Objective:  BP 120/78   Pulse 72   Temp 98.2 F (36.8 C) (Oral)   Ht 5' 8.25" (1.734 m)   Wt 179 lb 9.6 oz (81.5 kg)   SpO2 98%   BMI 27.11 kg/m      08/19/2023    9:06 AM 02/28/2022    8:57 AM 10/12/2019    1:55  PM  BP/Weight  Systolic BP 120 130 128  Diastolic BP 78 84 80  Wt. (Lbs) 179.6 181.8 172  BMI 27.11 kg/m2 27.44 kg/m2 25.96 kg/m2    Physical Exam Vitals and nursing note reviewed.  Constitutional:      General: He is not in acute distress.    Appearance: Normal appearance.  HENT:     Head: Normocephalic and atraumatic.     Right Ear: Tympanic membrane normal.     Left Ear: Tympanic membrane normal.     Nose: Nose normal.     Mouth/Throat:     Pharynx: Oropharynx is clear.  Eyes:     General:        Right eye: No discharge.        Left eye: No discharge.     Conjunctiva/sclera: Conjunctivae normal.  Cardiovascular:     Rate and Rhythm: Normal rate and regular rhythm.  Pulmonary:     Effort: Pulmonary effort is normal.     Breath sounds: Normal breath sounds. No wheezing, rhonchi or rales.  Abdominal:     General: There is no distension.     Palpations: Abdomen is soft.     Tenderness:  There is no abdominal tenderness.  Musculoskeletal:     Cervical back: Neck supple.  Skin:    General: Skin is warm.     Findings: No rash.  Neurological:     General: No focal deficit present.     Mental Status: He is alert.  Psychiatric:        Mood and Affect: Mood normal.        Behavior: Behavior normal.     Lab Results  Component Value Date   WBC 4.6 08/08/2023   HGB 14.1 08/08/2023   HCT 42.0 08/08/2023   PLT 251 08/08/2023   GLUCOSE 90 08/08/2023   CHOL 142 08/08/2023   TRIG 42 08/08/2023   HDL 57 08/08/2023   LDLCALC 75 08/08/2023   ALT 40 08/08/2023   AST 24 08/08/2023   NA 142 08/08/2023   K 4.6 08/08/2023   CL 105 08/08/2023   CREATININE 1.18 08/08/2023   BUN 21 08/08/2023   CO2 24 08/08/2023     Assessment & Plan:   Problem List Items Addressed This Visit       Other   Annual physical exam - Primary    Doing well.  Labs reviewed with the patient.  Labs well-controlled.  Declined flu vaccine and COVID-19 vaccination.  Wants to wait on tetanus as  well.  Advised healthy diet and regular exercise.  Good hand hygiene.  Follow-up annually.       Follow-up:  Annually  Everlene Other DO [facility]

## 2024-03-12 ENCOUNTER — Ambulatory Visit (INDEPENDENT_AMBULATORY_CARE_PROVIDER_SITE_OTHER): Admitting: Family Medicine

## 2024-03-12 VITALS — BP 131/71 | HR 72 | Temp 98.1°F | Ht 68.25 in | Wt 182.0 lb

## 2024-03-12 DIAGNOSIS — R197 Diarrhea, unspecified: Secondary | ICD-10-CM | POA: Diagnosis not present

## 2024-03-12 NOTE — Patient Instructions (Signed)
Labs today.    We will call with results.

## 2024-03-14 ENCOUNTER — Encounter: Payer: Self-pay | Admitting: Family Medicine

## 2024-03-14 DIAGNOSIS — R197 Diarrhea, unspecified: Secondary | ICD-10-CM | POA: Insufficient documentation

## 2024-03-14 LAB — GI PROFILE, STOOL, PCR

## 2024-03-14 LAB — CMP14+EGFR
ALT: 104 IU/L — ABNORMAL HIGH (ref 0–44)
AST: 75 IU/L — ABNORMAL HIGH (ref 0–40)
Albumin: 4.3 g/dL (ref 4.1–5.1)
Alkaline Phosphatase: 67 IU/L (ref 44–121)
BUN/Creatinine Ratio: 15 (ref 9–20)
BUN: 18 mg/dL (ref 6–24)
Bilirubin Total: 0.4 mg/dL (ref 0.0–1.2)
CO2: 25 mmol/L (ref 20–29)
Calcium: 9 mg/dL (ref 8.7–10.2)
Chloride: 102 mmol/L (ref 96–106)
Creatinine, Ser: 1.17 mg/dL (ref 0.76–1.27)
Globulin, Total: 2.3 g/dL (ref 1.5–4.5)
Glucose: 98 mg/dL (ref 70–99)
Potassium: 4.8 mmol/L (ref 3.5–5.2)
Sodium: 139 mmol/L (ref 134–144)
Total Protein: 6.6 g/dL (ref 6.0–8.5)
eGFR: 80 mL/min/{1.73_m2} (ref >59)

## 2024-03-14 LAB — CBC
Hematocrit: 42.2 % (ref 37.5–51.0)
Hemoglobin: 13.9 g/dL (ref 13.0–17.7)
MCH: 29.5 pg (ref 26.6–33.0)
MCHC: 32.9 g/dL (ref 31.5–35.7)
MCV: 90 fL (ref 79–97)
Platelets: 234 10*3/uL (ref 150–450)
RBC: 4.71 x10E6/uL (ref 4.14–5.80)
RDW: 12 % (ref 11.6–15.4)
WBC: 2.5 10*3/uL — CL (ref 3.4–10.8)

## 2024-03-14 LAB — LIPASE: Lipase: 45 U/L (ref 13–78)

## 2024-03-14 NOTE — Assessment & Plan Note (Signed)
 Given persistence, labs were obtained. Stool study positive for enteropathogenic E. Coli.  Other notable findings, decreased white blood cell count and elevated liver enzymes.  Routine treatment not recommended.  Will reach out to the patient and see if he is still symptomatic.  Will recheck labs in the next 2 weeks.

## 2024-03-14 NOTE — Progress Notes (Signed)
 Subjective:  Patient ID: James Berg, male    DOB: 1981-11-01  Age: 43 y.o. MRN: [id number]  CC:   Chief Complaint  Patient presents with   Diarrhea    2x this month , denies anusea, vomiting,  Not eating much just once daily in evenings just drinking     HPI:  43 year old male presents for evaluation of diarrhea.  3 weeks ago patient had approximately 4 days of diarrhea with spontaneous resolution.  Patient states that afterwards he was back to his normal self.  However, this recently recurred 4 days ago.  He states that he is having watery diarrhea.  Occurring approximately 5 times a day.  Particularly after eating.  He has been drinking fluids.  He has been able to eat but is not very hungry.  States that he feels full after drinking liquids and is only eating about 1 meal a day.  He states that he is lost about 6 pounds in the past few weeks.  No reported sick.  He states that he ate something new but had a very small amount a few weeks ago.  No other changes or exposures he can think of.  No significant abdominal pain.  No hematochezia or melena.  Patient Active Problem List   Diagnosis Date Noted   Diarrhea 03/14/2024   Annual physical exam 03/01/2022    Social Hx   Social History   Socioeconomic History   Marital status: Married    Spouse name: Jamarien Rodkey   Number of children: 3   Years of education: Not on file   Highest education level: Not on file  Occupational History   Occupation: prev pastor  Tobacco Use   Smoking status: Never   Smokeless tobacco: Never  Vaping Use   Vaping status: Never Used  Substance and Sexual Activity   Alcohol use: No   Drug use: No   Sexual activity: Not on file  Other Topics Concern   Not on file  Social History Narrative   Not on file   Social Drivers of Health   Financial Resource Strain: Not on file  Food Insecurity: Not on file  Transportation Needs: Not on file  Physical Activity: Not on file  Stress: Not on  file  Social Connections: Not on file    Review of Systems Per HPI  Objective:  BP 131/71   Pulse 72   Temp 98.1 F (36.7 C)   Ht 5' 8.25" (1.734 m)   Wt 182 lb (82.6 kg)   SpO2 100%   BMI 27.47 kg/m      03/12/2024   10:06 AM 08/19/2023    9:06 AM 02/28/2022    8:57 AM  BP/Weight  Systolic BP 131 120 130  Diastolic BP 71 78 84  Wt. (Lbs) 182 179.6 181.8  BMI 27.47 kg/m2 27.11 kg/m2 27.44 kg/m2    Physical Exam Constitutional:      General: He is not in acute distress.    Appearance: Normal appearance.  HENT:     Head: Normocephalic and atraumatic.  Cardiovascular:     Rate and Rhythm: Normal rate and regular rhythm.  Pulmonary:     Effort: Pulmonary effort is normal.     Breath sounds: Normal breath sounds.  Abdominal:     General: There is no distension.     Palpations: Abdomen is soft.     Tenderness: There is no abdominal tenderness.  Neurological:     Mental Status: He is alert.  Lab Results  Component Value Date   WBC 2.5 (LL) 03/12/2024   HGB 13.9 03/12/2024   HCT 42.2 03/12/2024   PLT 234 03/12/2024   GLUCOSE 98 03/12/2024   CHOL 142 08/08/2023   TRIG 42 08/08/2023   HDL 57 08/08/2023   LDLCALC 75 08/08/2023   ALT 104 (H) 03/12/2024   AST 75 (H) 03/12/2024   NA 139 03/12/2024   K 4.8 03/12/2024   CL 102 03/12/2024   CREATININE 1.17 03/12/2024   BUN 18 03/12/2024   CO2 25 03/12/2024     Assessment & Plan:  Diarrhea, unspecified type Assessment & Plan: Given persistence, labs were obtained. Stool study positive for enteropathogenic E. Coli.  Other notable findings, decreased white blood cell count and elevated liver enzymes.  Routine treatment not recommended.  Will reach out to the patient and see if he is still symptomatic.  Will recheck labs in the next 2 weeks.  Orders: -     GI Profile, Stool, PCR -     CBC -     CMP14+EGFR -     Lipase    Follow-up:  2 weeks.  Kathleen Papa DO [facility]

## 2024-03-15 ENCOUNTER — Other Ambulatory Visit: Payer: Self-pay | Admitting: Family Medicine

## 2024-03-15 DIAGNOSIS — R7989 Other specified abnormal findings of blood chemistry: Secondary | ICD-10-CM

## 2024-03-15 DIAGNOSIS — D72819 Decreased white blood cell count, unspecified: Secondary | ICD-10-CM

## 2024-03-15 MED ORDER — AZITHROMYCIN 500 MG PO TABS: 500.0000 mg | ORAL_TABLET | Freq: Every day | ORAL | 0 refills | Status: DC

## 2024-03-17 ENCOUNTER — Other Ambulatory Visit: Payer: Self-pay | Admitting: Family Medicine

## 2024-03-17 MED ORDER — AZITHROMYCIN 500 MG PO TABS: 500.0000 mg | ORAL_TABLET | Freq: Every day | ORAL | 0 refills | Status: AC

## 2024-03-17 NOTE — Progress Notes (Signed)
 Fixed.

## 2024-03-17 NOTE — Telephone Encounter (Signed)
 Medication was sent to [facility], patient requesting prescription sent to [facility]. Med pended, preferred pharmacy updated.    Copied from CRM [id number]. Topic: Clinical - Prescription Issue >> Mar 17, 2024  1:35 PM Felizardo Hotter wrote: Reason for CRM: Received call from pt regarding azithromycin  (ZITHROMAX ) 500 MG tablet needs to be sent to [id number] - [place] [place] - [address] AT [facility] [address] [place] [address] Phone: [phone] Fax: [phone]. Please call [phone] pt when sent to correct pharmacy.

## 2024-03-26 DIAGNOSIS — R7989 Other specified abnormal findings of blood chemistry: Secondary | ICD-10-CM | POA: Diagnosis not present

## 2024-03-26 DIAGNOSIS — D72819 Decreased white blood cell count, unspecified: Secondary | ICD-10-CM | POA: Diagnosis not present

## 2024-03-27 LAB — CBC WITH DIFFERENTIAL/PLATELET
Basophils Absolute: 0 10*3/uL (ref 0.0–0.2)
Basos: 1 %
EOS (ABSOLUTE): 0.1 10*3/uL (ref 0.0–0.4)
Eos: 2 %
Hematocrit: 40.7 % (ref 37.5–51.0)
Hemoglobin: 13.5 g/dL (ref 13.0–17.7)
Immature Grans (Abs): 0 10*3/uL (ref 0.0–0.1)
Immature Granulocytes: 0 %
Lymphocytes Absolute: 1.9 10*3/uL (ref 0.7–3.1)
Lymphs: 49 %
MCH: 29.8 pg (ref 26.6–33.0)
MCHC: 33.2 g/dL (ref 31.5–35.7)
MCV: 90 fL (ref 79–97)
Monocytes Absolute: 0.4 10*3/uL (ref 0.1–0.9)
Monocytes: 10 %
Neutrophils Absolute: 1.5 10*3/uL (ref 1.4–7.0)
Neutrophils: 38 %
Platelets: 249 10*3/uL (ref 150–450)
RBC: 4.53 x10E6/uL (ref 4.14–5.80)
RDW: 11.9 % (ref 11.6–15.4)
WBC: 3.8 10*3/uL (ref 3.4–10.8)

## 2024-03-27 LAB — HEPATIC FUNCTION PANEL
ALT: 39 IU/L (ref 0–44)
AST: 21 IU/L (ref 0–40)
Albumin: 4.2 g/dL (ref 4.1–5.1)
Alkaline Phosphatase: 54 IU/L (ref 44–121)
Bilirubin Total: 0.9 mg/dL (ref 0.0–1.2)
Bilirubin, Direct: 0.34 mg/dL (ref 0.00–0.40)
Total Protein: 6.3 g/dL (ref 6.0–8.5)

## 2024-03-28 ENCOUNTER — Encounter: Payer: Self-pay | Admitting: Family Medicine

## 2024-09-17 DIAGNOSIS — M25511 Pain in right shoulder: Secondary | ICD-10-CM | POA: Diagnosis not present
